# Patient Record
Sex: Male | Born: 1950 | Hispanic: Yes | Marital: Married | Smoking: Never smoker
Health system: Southern US, Community
[De-identification: ages and names within clinical notes are randomized; demographics above are authoritative.]

## PROBLEM LIST (undated history)

## (undated) DIAGNOSIS — E7800 Pure hypercholesterolemia, unspecified: Secondary | ICD-10-CM

## (undated) DIAGNOSIS — E079 Disorder of thyroid, unspecified: Secondary | ICD-10-CM

## (undated) DIAGNOSIS — I10 Essential (primary) hypertension: Secondary | ICD-10-CM

## (undated) DIAGNOSIS — F419 Anxiety disorder, unspecified: Secondary | ICD-10-CM

## (undated) DIAGNOSIS — E119 Type 2 diabetes mellitus without complications: Secondary | ICD-10-CM

## (undated) HISTORY — PX: PANCREATIC CYST EXCISION: SHX2157

## (undated) HISTORY — PX: COLON SURGERY: SHX602

---

## 2004-06-21 ENCOUNTER — Emergency Department: Payer: Self-pay | Admitting: Emergency Medicine

## 2004-06-22 ENCOUNTER — Other Ambulatory Visit: Payer: Self-pay

## 2004-06-25 ENCOUNTER — Emergency Department: Payer: Self-pay | Admitting: Emergency Medicine

## 2004-07-07 ENCOUNTER — Emergency Department: Payer: Self-pay | Admitting: Emergency Medicine

## 2004-07-07 ENCOUNTER — Other Ambulatory Visit: Payer: Self-pay

## 2013-01-24 DIAGNOSIS — R140 Abdominal distension (gaseous): Secondary | ICD-10-CM | POA: Insufficient documentation

## 2013-01-24 DIAGNOSIS — Z1211 Encounter for screening for malignant neoplasm of colon: Secondary | ICD-10-CM | POA: Insufficient documentation

## 2013-12-04 ENCOUNTER — Emergency Department: Payer: Self-pay | Admitting: Emergency Medicine

## 2013-12-04 LAB — COMPREHENSIVE METABOLIC PANEL
AST: 28 U/L (ref 15–37)
Albumin: 4.2 g/dL (ref 3.4–5.0)
Alkaline Phosphatase: 81 U/L
Anion Gap: 7 (ref 7–16)
BILIRUBIN TOTAL: 0.6 mg/dL (ref 0.2–1.0)
BUN: 14 mg/dL (ref 7–18)
Calcium, Total: 8.6 mg/dL (ref 8.5–10.1)
Chloride: 105 mmol/L (ref 98–107)
Co2: 31 mmol/L (ref 21–32)
Creatinine: 0.74 mg/dL (ref 0.60–1.30)
EGFR (African American): >60
GLUCOSE: 107 mg/dL — AB (ref 65–99)
Osmolality: 286 (ref 275–301)
POTASSIUM: 3.9 mmol/L (ref 3.5–5.1)
SGPT (ALT): 36 U/L
SODIUM: 143 mmol/L (ref 136–145)
Total Protein: 7.7 g/dL (ref 6.4–8.2)

## 2013-12-04 LAB — CBC
HCT: 45.3 % (ref 40.0–52.0)
HGB: 15.4 g/dL (ref 13.0–18.0)
MCH: 31 pg (ref 26.0–34.0)
MCHC: 34.1 g/dL (ref 32.0–36.0)
MCV: 91 fL (ref 80–100)
Platelet: 115 10*3/uL — ABNORMAL LOW (ref 150–440)
RBC: 4.98 10*6/uL (ref 4.40–5.90)
RDW: 13.1 % (ref 11.5–14.5)
WBC: 3.8 10*3/uL (ref 3.8–10.6)

## 2013-12-04 LAB — LIPASE, BLOOD: LIPASE: 52 U/L — AB (ref 73–393)

## 2014-05-10 ENCOUNTER — Emergency Department
Admit: 2014-05-10 | Disposition: A | Discharge status: Home / Self Care | Payer: Self-pay | Admitting: Emergency Medicine

## 2014-06-02 ENCOUNTER — Emergency Department
Admit: 2014-06-02 | Disposition: A | Discharge status: Home / Self Care | Payer: Self-pay | Admitting: Emergency Medicine

## 2014-06-02 LAB — CBC WITH DIFFERENTIAL/PLATELET
BASOS PCT: 0.3 %
Basophil #: 0 10*3/uL (ref 0.0–0.1)
EOS PCT: 1.2 %
Eosinophil #: 0.1 10*3/uL (ref 0.0–0.7)
HCT: 43.4 % (ref 40.0–52.0)
HGB: 15.2 g/dL (ref 13.0–18.0)
Lymphocyte #: 0.9 10*3/uL — ABNORMAL LOW (ref 1.0–3.6)
Lymphocyte %: 14.9 %
MCH: 31.6 pg (ref 26.0–34.0)
MCHC: 35 g/dL (ref 32.0–36.0)
MCV: 90 fL (ref 80–100)
MONO ABS: 0.3 x10 3/mm (ref 0.2–1.0)
Monocyte %: 5.1 %
Neutrophil #: 4.8 10*3/uL (ref 1.4–6.5)
Neutrophil %: 78.5 %
PLATELETS: 114 10*3/uL — AB (ref 150–440)
RBC: 4.81 10*6/uL (ref 4.40–5.90)
RDW: 13.6 % (ref 11.5–14.5)
WBC: 6.1 10*3/uL (ref 3.8–10.6)

## 2014-06-02 LAB — URINALYSIS, COMPLETE
BILIRUBIN, UR: NEGATIVE
Bacteria: NONE SEEN
Blood: NEGATIVE
Leukocyte Esterase: NEGATIVE
Nitrite: NEGATIVE
PH: 8 (ref 4.5–8.0)
Protein: NEGATIVE
SPECIFIC GRAVITY: 1.018 (ref 1.003–1.030)
SQUAMOUS EPITHELIAL: NONE SEEN

## 2014-06-02 LAB — COMPREHENSIVE METABOLIC PANEL
ALBUMIN: 4.8 g/dL
ALK PHOS: 78 U/L
ANION GAP: 7 (ref 7–16)
BILIRUBIN TOTAL: 0.8 mg/dL
BUN: 15 mg/dL
CHLORIDE: 103 mmol/L
CREATININE: 0.69 mg/dL
Calcium, Total: 9.1 mg/dL
Co2: 27 mmol/L
EGFR (African American): >60
EGFR (Non-African Amer.): >60
Glucose: 180 mg/dL — ABNORMAL HIGH
Potassium: 3.8 mmol/L
SGOT(AST): 35 U/L
SGPT (ALT): 45 U/L
Sodium: 137 mmol/L
TOTAL PROTEIN: 7.9 g/dL

## 2014-06-02 LAB — LIPASE, BLOOD: Lipase: 24 U/L

## 2014-06-02 LAB — TROPONIN I: Troponin-I: <0.03 ng/mL

## 2014-06-03 LAB — LACTIC ACID, PLASMA: Lactic Acid, Venous: 1.1 mmol/L

## 2014-10-22 ENCOUNTER — Encounter: Payer: Self-pay | Admitting: Emergency Medicine

## 2014-10-22 ENCOUNTER — Emergency Department
Admission: EM | Admit: 2014-10-22 | Discharge: 2014-10-22 | Disposition: A | Discharge status: Home / Self Care | Payer: Self-pay | Attending: Emergency Medicine | Admitting: Emergency Medicine

## 2014-10-22 DIAGNOSIS — Z7951 Long term (current) use of inhaled steroids: Secondary | ICD-10-CM | POA: Insufficient documentation

## 2014-10-22 DIAGNOSIS — H6983 Other specified disorders of Eustachian tube, bilateral: Secondary | ICD-10-CM

## 2014-10-22 DIAGNOSIS — E119 Type 2 diabetes mellitus without complications: Secondary | ICD-10-CM | POA: Insufficient documentation

## 2014-10-22 DIAGNOSIS — H6993 Unspecified Eustachian tube disorder, bilateral: Secondary | ICD-10-CM | POA: Insufficient documentation

## 2014-10-22 DIAGNOSIS — H6523 Chronic serous otitis media, bilateral: Secondary | ICD-10-CM | POA: Insufficient documentation

## 2014-10-22 HISTORY — DX: Type 2 diabetes mellitus without complications: E11.9

## 2014-10-22 MED ORDER — AZITHROMYCIN 250 MG PO TABS: ORAL_TABLET | ORAL | Status: DC

## 2014-10-22 NOTE — ED Notes (Signed)
Pt to ed with c/o fluid in bilat ears x several months.  States he has been seen at [facility] and used meds without relief. Pt denies pain in ears.  Pt denies dizziness.

## 2014-10-22 NOTE — ED Provider Notes (Signed)
[facility] Emergency Department Provider Note ____________________________________________  Time seen: Approximately 9:41 AM  I have reviewed the triage vital signs and the nursing notes.   HISTORY  Chief Complaint Otalgia   HPI Alvin [name] is a 64 y.o. male who presents to the emergency department for evaluation of a fullness and fluid like feeling in both ears. Symptoms have been present for several months and he has seen his provider [facility] a few times for the same. He has been given fluticasone nasal spray and cetirizine and has been advised to continue those medications. A referral was written to the ENT specialist, however he has not been contacted regarding his appointment. He is here today for continued symptoms. Symptoms aren't not worse, but not improving with medications given by [facility]. He denies pain in the ears and he denies fever.   Past Medical History  Diagnosis Date  . Diabetes mellitus without complication     There are no active problems to display for this patient.   History reviewed. No pertinent past surgical history.  Current Outpatient Rx  Name  Route  Sig  Dispense  Refill  . fluticasone (FLONASE) 50 MCG/ACT nasal spray   Each Nare   Place 2 sprays into both nostrils daily.         Marland Kitchen azithromycin (ZITHROMAX Z-PAK) 250 MG tablet      Take 2 tablets (500 mg) on  Day 1,  followed by 1 tablet (250 mg) once daily on Days 2 through 5.   6 each   0     Allergies Review of patient's allergies indicates no known allergies.  History reviewed. No pertinent family history.  Social History Social History  Substance Use Topics  . Smoking status: Never Smoker   . Smokeless tobacco: None  . Alcohol Use: No    Review of Systems Constitutional: No fever/chills Eyes: No visual changes. ENT: Earache:no; Discharge: no; Hearing Loss: yes; Trauma: no; Sore throat: no;  Respiratory: No Cough or  dyspnea Gastrointestinal: No abdominal pain.  No nausea, no vomiting.  No diarrhea.  No constipation. Musculoskeletal: Negative for pain. Skin: Negative for rash. Neurological: Negative for headaches, focal weakness or numbness.  10-point ROS otherwise negative.  ____________________________________________   PHYSICAL EXAM:  VITAL SIGNS: ED Triage Vitals  Enc Vitals Group     BP 10/22/14 0835 146/88 mmHg     Pulse Rate 10/22/14 0835 96     Resp 10/22/14 0835 20     Temp 10/22/14 0835 98.9 F (37.2 C)     Temp Source 10/22/14 0835 Oral     SpO2 10/22/14 0835 99 %     Weight 10/22/14 0835 220 lb (99.791 kg)     Height 10/22/14 0835 5\' 4"  (1.626 m)     Head Cir --      Peak Flow --      Pain Score 10/22/14 0836 0     Pain Loc --      Pain Edu? --      Excl. in GC? --     Constitutional: Alert and oriented. Well appearing and in no acute distress. Eyes: Conjunctivae are normal. PERRL. EOMI. Ears: Pain with movement of auricle: no; External canal: Normal; TM's: Serous otitis media bilaterally with mild TM erythema;   Head: Atraumatic. Nose: No congestion/rhinnorhea. Mouth/Throat: Mucous membranes are moist.  Oropharynx non-erythematous. Neck: No stridor.  Hematological/Lymphatic/Immunilogical: No cervical lymphadenopathy. Cardiovascular: Normal rate, regular rhythm.Good peripheral circulation. Respiratory: Normal respiratory effort.  No retractions.  Gastrointestinal: Soft and nontender. No distention. No abdominal bruits. No CVA tenderness. Musculoskeletal: Full ROM x 4. Neurologic:  Normal speech and language. No gross focal neurologic deficits are appreciated. Speech is normal. No gait instability. Skin:  Skin is warm, dry and intact. No rash noted. Psychiatric: Mood and affect are normal. Speech and behavior are normal.  ____________________________________________   LABS (all labs ordered are listed, but only abnormal results are displayed)  Labs Reviewed - No  data to display ____________________________________________   RADIOLOGY   ____________________________________________   PROCEDURES  Procedure(s) performed: None  ____________________________________________   INITIAL IMPRESSION / ASSESSMENT AND PLAN / ED COURSE  Pertinent labs & imaging results that were available during my care of the patient were reviewed by me and considered in my medical decision making (see chart for details).  Because the patient has had symptoms for several months and is diabetic I will prescribe a round of azithromycin. He has not been on antibiotics since the onset of this complaint.  Patient was advised to follow up with the primary care provider or ENT doctor for symptoms that are not improving over the next 48 hours. Return to the ER for symptoms that change or worsen if you are unable to schedule an appointment. ____________________________________________   FINAL CLINICAL IMPRESSION(S) / ED DIAGNOSES  Final diagnoses:  Bilateral chronic serous otitis media  Eustachian tube dysfunction, bilateral      Chinita Pester, FNP 10/22/14 0945  Jeanmarie Plant, MD 10/22/14 1501

## 2014-11-25 ENCOUNTER — Other Ambulatory Visit: Payer: Self-pay

## 2014-11-25 ENCOUNTER — Telehealth: Payer: Self-pay

## 2014-11-25 NOTE — Telephone Encounter (Signed)
Called patient and he son answered the call. I asked to speak with his father and he told me that his father doesn't live with him. I then asked him for his father's contact number and he hesitated. He asked me the reason for contacting his father and I told him he had an appointment with us on Friday 11/27/2014 and needed to know the exact reason why he was coming to our office. He then stated that he has [facility] and was told that he needed to be seen by a Gastrologist but the [facility] of [facility] and not us since he is a self-pay patient. I agreed with him. So I asked who was his father's primary care doctor so I could call for a referral. He stated that [facility] is seen [facility]. I told him that I would call [facility]. He agreed. I called [facility] and spoke to Mant[place]onya ([employer] Coordinator) and asked why patient was referred to our office. She stated that nobody had contacted us to set up an appointment for the patient. The only thing she told me was that he had a colonoscopy done on 11/02/2014 but that everything was fine. She was not sure who had called us to schedule this appointment.  I then called [facility] and asked if patient had an upcoming appointment to be seen by GI and they stated that patient didn't have anything scheduled. So, after all, I ended up calling his son again and told him what was going on. He then finally gave me his father's cell phone [phone]) and asked for me to call him since he had no clue of what is going on. I thanked him and told him that I would call his father and ask. Called patient and it went straight to voicemail. I left him a voicemail in Spanish so he could return my call.

## 2014-11-25 NOTE — Telephone Encounter (Signed)
Called patient and had to leave a voicemail again. If patient doesn't call, we will cancel his appointment since we are not sure why he needed to be seen.

## 2014-11-26 ENCOUNTER — Telehealth: Payer: Self-pay

## 2014-11-26 ENCOUNTER — Encounter: Payer: Self-pay | Admitting: *Deleted

## 2014-11-26 NOTE — Telephone Encounter (Signed)
Called patient again and had to leave a voicemail. Since patient did not answer my calls, I will cancel his appointment.

## 2014-11-27 ENCOUNTER — Encounter (INDEPENDENT_AMBULATORY_CARE_PROVIDER_SITE_OTHER): Payer: Self-pay

## 2014-11-27 ENCOUNTER — Ambulatory Visit: Payer: Self-pay | Admitting: Surgery

## 2014-11-27 ENCOUNTER — Other Ambulatory Visit: Payer: Self-pay

## 2014-11-27 ENCOUNTER — Encounter: Payer: Self-pay | Admitting: Surgery

## 2014-11-27 ENCOUNTER — Ambulatory Visit (INDEPENDENT_AMBULATORY_CARE_PROVIDER_SITE_OTHER): Payer: Self-pay | Admitting: Surgery

## 2014-11-27 VITALS — BP 143/81 | HR 80 | Temp 98.3°F | Ht 59.0 in | Wt 222.0 lb

## 2014-11-27 DIAGNOSIS — K8020 Calculus of gallbladder without cholecystitis without obstruction: Secondary | ICD-10-CM

## 2014-11-27 DIAGNOSIS — R1031 Right lower quadrant pain: Secondary | ICD-10-CM

## 2014-11-27 NOTE — Patient Instructions (Signed)
Por favor necesito que mantenga un diario todos los dias de el tipo de comida que come, Psychologist, prison and probation servicessi le causa dolor, o vomita.   Reyne DumasLe van a llamar para hacerle un ultrasonido.  Lo veo en dos semanas en la oficina de [place].

## 2014-11-30 ENCOUNTER — Telehealth: Payer: Self-pay

## 2014-11-30 NOTE — Telephone Encounter (Signed)
[name] and requested for his medical records. Alvin [name] (referral coordinator) stated that she would fax that information for us.

## 2014-12-01 DIAGNOSIS — R1031 Right lower quadrant pain: Secondary | ICD-10-CM | POA: Insufficient documentation

## 2014-12-01 DIAGNOSIS — K8020 Calculus of gallbladder without cholecystitis without obstruction: Secondary | ICD-10-CM | POA: Insufficient documentation

## 2014-12-01 NOTE — Progress Notes (Signed)
Patient ID: Alvin [name], male   DOB: 1950-08-27, 64 y.o.   MRN: [id number]  Chief Complaint  Patient presents with  . Follow-up    ED-Gallbladder 05/2014    HPI  Alvin [name] is a 64 y.o. male. referred by his PCP with a several week history of what he describes as inflammation in his abdomen in both the RUQ and LLQ.  He does not say specifically that he has abdominal pain.   He recently had a colonoscopy at [facility] showing no significant findings.  He was seen in the ER about 6 months ago with pain his RUQ pain and was told her had gallstones.  He did not seek surgical referral.  In addition, he did not have any pain issues until several weeks ago when his symptoms restarted.  Denies nausea or vomiting and does not relate "inflammatio" in his abdomen to any particular foods.  Past Medical History  Diagnosis Date  . Diabetes mellitus without complication (HCC)   Gout Seasonal allergies.  Past surgical history is significant for pancreatic surgery in [place] 15 years ago for a "cyst".     Family History  Problem Relation Age of Onset  . Alcohol abuse Neg Hx   . Arthritis Neg Hx   . Asthma Neg Hx   . Birth defects Neg Hx   . Cancer Neg Hx   . COPD Neg Hx   . Depression Neg Hx   . Diabetes Neg Hx   . Drug abuse Neg Hx   . Early death Neg Hx   . Hearing loss Neg Hx   . Heart disease Neg Hx   . Hyperlipidemia Neg Hx   . Hypertension Neg Hx   . Kidney disease Neg Hx   . Learning disabilities Neg Hx   . Mental illness Neg Hx   . Mental retardation Neg Hx   . Stroke Neg Hx   . Miscarriages / Stillbirths Neg Hx   . Vision loss Neg Hx   . Varicose Veins Neg Hx     Social History Social History  Substance Use Topics  . Smoking status: Never Smoker   . Smokeless tobacco: None  . Alcohol Use: 0.0 oz/week    0 Standard drinks or equivalent per week     Comment: occas    No Known Allergies  Current Outpatient Prescriptions  Medication Sig Dispense Refill  . allopurinol  (ZYLOPRIM) 300 MG tablet Take 300 mg by mouth.    . cetirizine (ZYRTEC) 10 MG tablet Take 10 mg by mouth.    . fluticasone (FLONASE) 50 MCG/ACT nasal spray Place 2 sprays into both nostrils daily.    Marland Kitchen glipiZIDE (GLUCOTROL) 5 MG tablet Take 5 mg by mouth 2 (two) times daily before a meal.      No current facility-administered medications for this visit.   Blood pressure 143/81, pulse 80, temperature 98.3 F (36.8 C), temperature source Oral, height  (1.499 m), weight 222 lb (100.699 kg).  No results found for this or any previous visit (from the past 48 hour(s)). No results found.  Review of Systems  Constitutional: Negative.   Eyes: Negative.   Gastrointestinal: Negative for nausea and vomiting.  Genitourinary: Negative.   Musculoskeletal: Negative.   Neurological: Negative.   All other systems reviewed and are negative.   Physical Exam  Constitutional: He is oriented to person, place, and time and well-developed, well-nourished, and in no distress. No distress.  HENT:  Head: Normocephalic and atraumatic.  Eyes: Conjunctivae  are normal. Pupils are equal, round, and reactive to light. No scleral icterus.  Neck: No JVD present. No tracheal deviation present. No thyromegaly present.  Cardiovascular: Normal rate and regular rhythm.   No murmur heard. Pulmonary/Chest: Effort normal and breath sounds normal. No respiratory distress. He has no wheezes.  Abdominal: Soft. He exhibits no distension and no mass. There is no tenderness. There is no rebound and no guarding.    Neurological: He is alert and oriented to person, place, and time.  Skin: Skin is warm and dry. He is not diaphoretic.  Psychiatric: Mood, memory, affect and judgment normal.    Data Reviewed  I have personally reviewed the patient's imaging, laboratory findings and medical records.    Assessment    64 y/o male with gallstones and perhaps biliary colic vs IBS.  He is requesting another ultrasound of his  liver and gallbladder.    Plan   A repeat ultrasound was ordered,  I told him that I did not think gallstones would has resolved.  I asked him to keep a food abominal pain and food diary.  I interviewed him with a certified spanish interpreter present.  I will request medical records from [facility] who is his PCP.     Natale LayMark Ras Kollman MD, FACS 12/01/2014, 9:16 PM

## 2014-12-02 ENCOUNTER — Telehealth: Payer: Self-pay

## 2014-12-02 NOTE — Telephone Encounter (Signed)
Called central scheduling and was able to schedule patient's Ultrasound for Friday 12/04/2014 at 9:30 AM. Then I called and spoke to patient's son. He was told to be there at 9:15 AM and to be fasting for this procedure. His son understood.  [place].

## 2014-12-04 ENCOUNTER — Ambulatory Visit
Admission: RE | Admit: 2014-12-04 | Discharge: 2014-12-04 | Disposition: A | Discharge status: Home / Self Care | Payer: Self-pay | Source: Ambulatory Visit | Attending: Surgery | Admitting: Surgery

## 2014-12-04 DIAGNOSIS — R1031 Right lower quadrant pain: Secondary | ICD-10-CM | POA: Insufficient documentation

## 2014-12-04 DIAGNOSIS — K8020 Calculus of gallbladder without cholecystitis without obstruction: Secondary | ICD-10-CM | POA: Insufficient documentation

## 2014-12-04 DIAGNOSIS — K760 Fatty (change of) liver, not elsewhere classified: Secondary | ICD-10-CM | POA: Insufficient documentation

## 2014-12-10 ENCOUNTER — Ambulatory Visit: Payer: Self-pay | Admitting: Surgery

## 2014-12-10 ENCOUNTER — Ambulatory Visit (INDEPENDENT_AMBULATORY_CARE_PROVIDER_SITE_OTHER): Payer: Self-pay | Admitting: Surgery

## 2014-12-10 ENCOUNTER — Encounter: Payer: Self-pay | Admitting: Surgery

## 2014-12-10 VITALS — BP 161/94 | HR 75 | Temp 97.8°F | Ht 63.0 in | Wt 228.0 lb

## 2014-12-10 DIAGNOSIS — K8020 Calculus of gallbladder without cholecystitis without obstruction: Secondary | ICD-10-CM

## 2014-12-10 NOTE — Progress Notes (Signed)
Surgery clinic  She returns today. He is without complaints. He denies any pain since our last visit. Repeat ultrasound did demonstrate cholelithiasis.  I again spoke to him about postprandial right upper quadrant abdominal pain which he has denied on multiple occasions. There is been no nausea and no vomiting. 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 Those records are not available to me.  Filed Vitals:   12/10/14 1311  BP: 161/94  Pulse: 75  Temp: 97.8 F (36.6 C)    On physical examination he is alert and oriented. Sclera are a neck trach. Abdomen is soft and nontender there is a midline upper scar. Small umbilical reducible hernia is present.  Impression abdominal pain and cholelithiasis. I do not believe that any of his symptoms are consistent with biliary colic.  I used a certified Research officer, trade unionpanish interpreter. His son was present.  Recommendations at present no surgical intervention is recommended. He was told to contact his primary care physician if he has continued abdominal pain. I believe that a cholecystectomy and may be difficult due to his previous pancreatic procedure the details of which furthermore unknown to me.

## 2014-12-10 NOTE — Patient Instructions (Signed)
Follow up in our office as needed. 

## 2015-02-26 ENCOUNTER — Emergency Department
Admission: EM | Admit: 2015-02-26 | Discharge: 2015-02-26 | Disposition: A | Discharge status: Home / Self Care | Payer: Self-pay | Attending: Emergency Medicine | Admitting: Emergency Medicine

## 2015-02-26 ENCOUNTER — Encounter: Payer: Self-pay | Admitting: Emergency Medicine

## 2015-02-26 DIAGNOSIS — E119 Type 2 diabetes mellitus without complications: Secondary | ICD-10-CM | POA: Insufficient documentation

## 2015-02-26 DIAGNOSIS — Z7984 Long term (current) use of oral hypoglycemic drugs: Secondary | ICD-10-CM | POA: Insufficient documentation

## 2015-02-26 DIAGNOSIS — J029 Acute pharyngitis, unspecified: Secondary | ICD-10-CM | POA: Insufficient documentation

## 2015-02-26 DIAGNOSIS — Z79899 Other long term (current) drug therapy: Secondary | ICD-10-CM | POA: Insufficient documentation

## 2015-02-26 DIAGNOSIS — Z7951 Long term (current) use of inhaled steroids: Secondary | ICD-10-CM | POA: Insufficient documentation

## 2015-02-26 DIAGNOSIS — H109 Unspecified conjunctivitis: Secondary | ICD-10-CM | POA: Insufficient documentation

## 2015-02-26 LAB — POCT RAPID STREP A: STREPTOCOCCUS, GROUP A SCREEN (DIRECT): NEGATIVE

## 2015-02-26 MED ORDER — AZITHROMYCIN 250 MG PO TABS: ORAL_TABLET | ORAL | Status: DC

## 2015-02-26 MED ORDER — SULFACETAMIDE SODIUM 10 % OP SOLN: 2.0000 [drp] | Freq: Four times a day (QID) | OPHTHALMIC | Status: DC

## 2015-02-26 NOTE — ED Provider Notes (Signed)
[facility] Emergency Department Provider Note  ____________________________________________  Time seen: Approximately 8:25 AM  I have reviewed the triage vital signs and the nursing notes.   HISTORY  Chief Complaint Eye Drainage   HPI Alvin [name] is a 65 y.o. male presents for evaluation of bilateral eye redness, and sore throat.Patient reports symptoms of an ongoing for at least 2 days. Has tried over-the-counter medication with no relief. In addition patient complains that his throat feels full hurts to swallow. Denies any shortness of breath or dyspnea. Patient's past medical history significant for diabetes   Past Medical History  Diagnosis Date  . Diabetes mellitus without complication [facility])     Patient Active Problem List   Diagnosis Date Noted  . Right lower quadrant abdominal pain 12/01/2014  . Gallstones 12/01/2014  . Encounter for screening for malignant neoplasm of colon 01/24/2013  . Abdominal bloating 01/24/2013    Past Surgical History  Procedure Laterality Date  . Pancreatic cyst excision      Current Outpatient Rx  Name  Route  Sig  Dispense  Refill  . allopurinol (ZYLOPRIM) 300 MG tablet   Oral   Take 300 mg by mouth.         . cetirizine (ZYRTEC) 10 MG tablet   Oral   Take 10 mg by mouth.         . fluticasone (FLONASE) 50 MCG/ACT nasal spray   Each Nare   Place 2 sprays into both nostrils daily.         Marland Kitchen glipiZIDE (GLUCOTROL) 5 MG tablet   Oral   Take 5 mg by mouth 2 (two) times daily before a meal.          . sulfacetamide (BLEPH-10) 10 % ophthalmic solution   Both Eyes   Place 2 drops into both eyes 4 (four) times daily.   5 mL   0     Allergies Review of patient's allergies indicates no known allergies.  Family History  Problem Relation Age of Onset  . Alcohol abuse Neg Hx   . Arthritis Neg Hx   . Asthma Neg Hx   . Birth defects Neg Hx   . Cancer Neg Hx   . COPD Neg Hx   . Depression  Neg Hx   . Diabetes Neg Hx   . Drug abuse Neg Hx   . Early death Neg Hx   . Hearing loss Neg Hx   . Heart disease Neg Hx   . Hyperlipidemia Neg Hx   . Hypertension Neg Hx   . Kidney disease Neg Hx   . Learning disabilities Neg Hx   . Mental illness Neg Hx   . Mental retardation Neg Hx   . Stroke Neg Hx   . Miscarriages / Stillbirths Neg Hx   . Vision loss Neg Hx   . Varicose Veins Neg Hx     Social History Social History  Substance Use Topics  . Smoking status: Never Smoker   . Smokeless tobacco: Never Used  . Alcohol Use: 0.0 oz/week    0 Standard drinks or equivalent per week     Comment: occas    Review of Systems Constitutional: No fever/chills Eyes: No visual changes. ENT: No sore throat. Cardiovascular: Denies chest pain. Respiratory: Denies shortness of breath. Gastrointestinal: No abdominal pain.  No nausea, no vomiting.  No diarrhea.  No constipation. Genitourinary: Negative for dysuria. Musculoskeletal: Negative for back pain. Skin: Negative for rash. Neurological: Negative for headaches,  focal weakness or numbness.  10-point ROS otherwise negative.  ____________________________________________   PHYSICAL EXAM:  VITAL SIGNS: ED Triage Vitals  Enc Vitals Group     BP 02/26/15 0824 155/85 mmHg     Pulse Rate 02/26/15 0823 77     Resp 02/26/15 0823 16     Temp 02/26/15 0823 98.1 F (36.7 C)     Temp Source 02/26/15 0823 Oral     SpO2 02/26/15 0823 95 %     Weight 02/26/15 0823 170 lb (77.111 kg)     Height --      Head Cir --      Peak Flow --      Pain Score 02/26/15 0820 2     Pain Loc --      Pain Edu? --      Excl. in GC? --     Constitutional: Alert and oriented. Well appearing and in no acute distress. Eyes: Conjunctiva are erythematous bilaterally drainage noted Head: Atraumatic. Nose: No congestion/rhinnorhea. Mouth/Throat: Mucous membranes are moist.  Oropharynx mildly erythematous. Neck: No stridor.   Cardiovascular: Normal  rate, regular rhythm. Grossly normal heart sounds.  Good peripheral circulation. Respiratory: Normal respiratory effort.  No retractions. Lungs CTAB. Musculoskeletal: No lower extremity tenderness nor edema.  No joint effusions. Neurologic:  Normal speech and language. No gross focal neurologic deficits are appreciated. No gait instability. Skin:  Skin is warm, dry and intact. No rash noted. Psychiatric: Mood and affect are normal. Speech and behavior are normal.  ____________________________________________   LABS (all labs ordered are listed, but only abnormal results are displayed)  Labs Reviewed  CULTURE, GROUP A STREP [facility])   ____________________________________________   PROCEDURES  Procedure(s) performed: None  Critical Care performed: No  ____________________________________________   INITIAL IMPRESSION / ASSESSMENT AND PLAN / ED COURSE  Pertinent labs & imaging results that were available during my care of the patient were reviewed by me and considered in my medical decision making (see chart for details).  Acute bilateral conjunctivitis, URI. Rx given for Sodium Sulamyd eye drops 4 times a day. Z-Pak as directed. Patient follow-up with PCP or return to the ER with any worsening symptomology. Patient voices no other emergency medical complaints at this time. ____________________________________________   FINAL CLINICAL IMPRESSION(S) / ED DIAGNOSES  Final diagnoses:  Bilateral conjunctivitis  Acute pharyngitis, unspecified pharyngitis type      Evangeline Dakin, PA-C 02/26/15 1610  Myrna Blazer, MD 02/26/15 [phone]

## 2015-02-26 NOTE — Discharge Instructions (Signed)
Conjuntivitis bacteriana  (Bacterial Conjunctivitis)  La conjuntivitis bacteriana, comnmente llamada ojo rosado, es una inflamacin de la membrana transparente que cubre la parte blanca del ojo (conjuntiva). La inflamacin tambin puede ocurrir en la parte interna de los prpados. Los vasos sanguneos de la conjuntiva se inflaman haciendo que el ojo se ponga de color rojo o rosado. La conjuntivitis bacteriana puede propagarse fcilmente de un ojo a otro y de Neomia Dear persona a otra (es contagiosa).  CAUSAS  La causa de la conjuntivitis bacteriana es una bacteria. La bacteria puede provenir de la propia piel, del tracto respiratorio superior o de otra persona que padece conjuntivitis bacteriana.  SNTOMAS  La parte del ojo o la zona interna del prpado que normalmente son blancas se ven de color rosado o rojo. El ojo rosado se asocia a Consulting civil engineer, lagrimeo y sensibilidad a Statistician. La conjuntivitis bacteriana se asocia a una secrecin espesa y [place] de los ojos. La secrecin puede convertirse en una costra en el prpado durante la noche, lo que hace que los prpados se peguen. Si tiene secrecin, tambin puede tener visin borrosa en el ojo afectado.  DIAGNSTICO  El diagnstico de conjuntivitis bacteriana lo realiza el mdico con un examen de la vista y por los sntomas que usted refiere. Su mdico observar si hay cambios en los tejidos de la superficie de los ojos, los que pueden indicar el tipo especfico de conjuntivitis. Tomar una muestra de la secrecin con un hisopo de algodn si la conjuntivitis es grave, si la crnea se ve afectada, o si sufre repetidas infecciones que no responden al tratamiento. Luego enva la muestra a un laboratorio para diagnosticar si la causa de la inflamacin es una infeccin bacteriana y para comprobar si responder a los antibiticos.  TRATAMIENTO  1. La conjuntivitis bacteriana se trata con antibiticos. Generalmente se recetan gotas oftlmicas con antibitico.  Tambin hay ungentos con antibiticos disponibles. En algunos casos se recetan antibiticos por va oral. Las lgrimas artificiales o el lavado del ojo pueden aliviar las [place]. INSTRUCCIONES PARA EL CUIDADO EN EL HOGAR  1. Para aliviar el malestar, aplique un pao hmedo, limpio y fro en el ojo durante 10 a 20 minutos, 3 a 4 veces por da. 2. Limpie suavemente las secreciones del ojo con un pao tibio y hmedo o una bolita de algodn. 3. Lave sus manos frecuentemente con agua y [place]. Use toallas de papel para secarse las manos. 4. No comparta toallones ni toallas de mano. As podr diseminarse la infeccin. 5. Cambie o lave la funda de la [employer]. 6. No use maquillaje en los ojos hasta que la infeccin haya desaparecido. 7. No maneje maquinaria ni conduzca vehculos si su visin es borrosa. 8. Deje de usar los entes de [place]. Consulte con su mdico si debe esterilizar o reemplazar sus lentes de contacto antes de usarlos de nuevo. Esto depende del tipo de lentes de contacto que use. 9. Al aplicarse el medicamento en el ojo infectado, no toque el borde del prpado con el frasco de gotas para los ojos o el tubo de [place]. SOLICITE ATENCIN MDICA DE INMEDIATO SI:   La infeccin no mejora dentro de los [address]  [address] despus de iniciar [address]  Tuvo una secrecin amarillenta en el ojo y vuelve a Research officer, trade union.  Aumenta el dolor en el ojo.  El enrojecimiento se extiende.  La visin se vuelve borrosa.  Tiene fiebre o sntomas persistentes durante ms de 2  3 das.  Lance Muss y  los síntomas empeoran repentinamente. °· Siente dolor, enrojecimiento o hinchazón en el rostro. °ASEGÚRESE DE QUE:  °· Comprende estas instrucciones. °· Controlará su enfermedad. °· Solicitará ayuda de inmediato si no mejora o si empeora. °  °Esta información no tiene como fin reemplazar el consejo del médico. Asegúrese de hacerle al médico cualquier pregunta que tenga. °  °Document Released:  11/02/2004 Document Revised: 10/18/2011 °Elsevier Interactive Patient Education ©2016 Elsevier Inc. ° °Cómo usar las gotas y las pomadas oftálmicas °(How to Use Eye Drops and Eye Ointments) °CÓMO APLICAR LAS GOTAS OFTÁLMICAS °Siga estos pasos cuando se ponga gotas oftálmicas: °2. Lávese las manos. °3. Incline la cabeza hacia atrás. °4. Ponga un dedo debajo del ojo y úselo para bajar suavemente el párpado inferior. Deje el dedo en el lugar. °5. Con la otra mano, sostenga el gotero entre el pulgar y el índice. °6. Coloque el gotero justo por encima del borde del párpado inferior. Sosténgalo tan cerca como pueda del ojo sin apoyarlo sobre este. °7. Mantenga firme la mano. Una forma de hacerlo es apoyar el índice contra la ceja. °8. Mire hacia arriba. °9. Lenta y suavemente apriete para que caiga una gota del medicamento en el ojo. °10. Cierre el ojo. °11. Coloque un dedo entre el párpado inferior y la nariz. Presione con suavidad durante 2 minutos. Esto aumenta el tiempo que el medicamento está en contacto con el ojo. También reduce los efectos secundarios que pueden aparecer si la gota llega al torrente sanguíneo a través de la nariz. °CÓMO APLICAR LAS POMADAS OFTÁLMICAS °Siga estos pasos cuando se aplique pomadas oftálmicas: °10. Lávese las manos. °11. Ponga un dedo debajo del ojo y úselo para bajar suavemente el párpado inferior. Deje el dedo en el lugar. °12. Con la otra mano, coloque la punta del tubo entre el pulgar y el índice con el resto de los dedos apoyados sobre la mejilla o la nariz. °13. Sostenga el tubo justo por encima del borde del párpado inferior sin apoyarlo sobre el párpado ni el globo ocular. °14. Mire hacia arriba. °15. Aplique la pomada a lo largo de la parte interna del párpado inferior. °16. Con suavidad, tire el párpado superior hacia arriba y mire hacia abajo. Esto hará que la pomada se esparza sobre la superficie del ojo. °17. Suelte el párpado superior. °18. Si puede, cierre los ojos  durante 1 o 2 minutos. °No se frote los ojos. Si aplicó correctamente la pomada, tendrá la visión borrosa durante algunos minutos. Esto es normal. °INFORMACIÓN ADICIONAL °· Use las gotas o la pomada oftálmica como se lo haya indicado el médico. °· Si le indicaron que use gotas y una pomada oftálmica, aplique primero las gotas; luego espere entre 3 y 4 minutos antes de colocarse la pomada. °· Intente no apoyar la punta del gotero o del tubo en el ojo. Un gotero o un tubo que hayan estado en contacto con el ojo pueden contaminarse. °  °Esta información no tiene como fin reemplazar el consejo del médico. Asegúrese de hacerle al médico cualquier pregunta que tenga. °  °Document Released: 01/23/2005 Document Revised: 06/09/2014 °Elsevier Interactive Patient Education ©2016 Elsevier Inc. ° °

## 2015-02-26 NOTE — ED Notes (Signed)
Pt also c/o sore throat.

## 2015-02-26 NOTE — ED Notes (Signed)
Bilateral eye redness   And irritation

## 2015-02-28 LAB — CULTURE, GROUP A STREP (THRC)

## 2015-03-28 ENCOUNTER — Emergency Department
Admission: EM | Admit: 2015-03-28 | Discharge: 2015-03-28 | Disposition: A | Discharge status: Home / Self Care | Payer: Self-pay | Attending: Emergency Medicine | Admitting: Emergency Medicine

## 2015-03-28 ENCOUNTER — Emergency Department: Payer: Self-pay

## 2015-03-28 ENCOUNTER — Encounter: Payer: Self-pay | Admitting: Emergency Medicine

## 2015-03-28 DIAGNOSIS — Z79899 Other long term (current) drug therapy: Secondary | ICD-10-CM | POA: Insufficient documentation

## 2015-03-28 DIAGNOSIS — Z7951 Long term (current) use of inhaled steroids: Secondary | ICD-10-CM | POA: Insufficient documentation

## 2015-03-28 DIAGNOSIS — J101 Influenza due to other identified influenza virus with other respiratory manifestations: Secondary | ICD-10-CM | POA: Insufficient documentation

## 2015-03-28 DIAGNOSIS — Z7984 Long term (current) use of oral hypoglycemic drugs: Secondary | ICD-10-CM | POA: Insufficient documentation

## 2015-03-28 DIAGNOSIS — E119 Type 2 diabetes mellitus without complications: Secondary | ICD-10-CM | POA: Insufficient documentation

## 2015-03-28 LAB — CBC WITH DIFFERENTIAL/PLATELET
Basophils Absolute: 0 10*3/uL (ref 0–0.1)
Basophils Relative: 0 %
EOS ABS: 0.3 10*3/uL (ref 0–0.7)
HCT: 42.7 % (ref 40.0–52.0)
Hemoglobin: 15 g/dL (ref 13.0–18.0)
LYMPHS ABS: 0.6 10*3/uL — AB (ref 1.0–3.6)
Lymphocytes Relative: 12 %
MCH: 30.9 pg (ref 26.0–34.0)
MCHC: 35 g/dL (ref 32.0–36.0)
MCV: 88.2 fL (ref 80.0–100.0)
Monocytes Absolute: 0.4 10*3/uL (ref 0.2–1.0)
Monocytes Relative: 8 %
Neutro Abs: 4.1 10*3/uL (ref 1.4–6.5)
Neutrophils Relative %: 75 %
PLATELETS: 90 10*3/uL — AB (ref 150–440)
RBC: 4.85 MIL/uL (ref 4.40–5.90)
RDW: 13.6 % (ref 11.5–14.5)
WBC: 5.5 10*3/uL (ref 3.8–10.6)

## 2015-03-28 LAB — BASIC METABOLIC PANEL
ANION GAP: 7 (ref 5–15)
BUN: 15 mg/dL (ref 6–20)
CHLORIDE: 108 mmol/L (ref 101–111)
CO2: 25 mmol/L (ref 22–32)
Calcium: 8.8 mg/dL — ABNORMAL LOW (ref 8.9–10.3)
Creatinine, Ser: 0.62 mg/dL (ref 0.61–1.24)
GFR calc Af Amer: >60 mL/min (ref >60)
GLUCOSE: 166 mg/dL — AB (ref 65–99)
POTASSIUM: 3.9 mmol/L (ref 3.5–5.1)
Sodium: 140 mmol/L (ref 135–145)

## 2015-03-28 LAB — RAPID INFLUENZA A&B ANTIGENS: Influenza A (ARMC): POSITIVE

## 2015-03-28 LAB — RAPID INFLUENZA A&B ANTIGENS (ARMC ONLY): INFLUENZA B (ARMC): NEGATIVE

## 2015-03-28 LAB — GLUCOSE, CAPILLARY: Glucose-Capillary: 149 mg/dL — ABNORMAL HIGH (ref 65–99)

## 2015-03-28 MED ORDER — KETOROLAC TROMETHAMINE 30 MG/ML IJ SOLN
30.0000 mg | Freq: Once | INTRAMUSCULAR | Status: AC
  Administered 2015-03-28: 30 mg via INTRAMUSCULAR
  Filled 2015-03-28: qty 1

## 2015-03-28 MED ORDER — OSELTAMIVIR PHOSPHATE 75 MG PO CAPS: 75.0000 mg | ORAL_CAPSULE | Freq: Two times a day (BID) | ORAL | Status: DC

## 2015-03-28 NOTE — ED Notes (Signed)
Patient transported to X-ray 

## 2015-03-28 NOTE — Discharge Instructions (Signed)
Gripe - Adultos  (Influenza, Adult)  La gripe es una infección viral del tracto respiratorio. Ocurre con más frecuencia en los meses de invierno, ya que las personas pasan más tiempo en contacto cercano. La gripe puede enfermarlo considerablemente. Se transmite fácilmente de una persona a otra (es contagiosa).  CAUSAS   La causa es un virus que infecta el tracto respiratorio. Puede contagiarse el virus al aspirar las gotitas que una persona infectada elimina al toser o estornudar. También puede contagiarse al tocar algo que fue recientemente contaminado con el virus y luego llevarse la mano a la boca, la nariz o los ojos.  RIESGOS Y COMPLICACIONES  Tendrá mayor riesgo de sufrir un resfrío grave si consume cigarrillos, es diabético, sufre una enfermedad cardíaca (como insuficiencia cardíaca) o pulmonar crónica (como asma) o si tiene debilitado el sistema inmunológico. Los ancianos y las mujeres embarazadas tienen más riesgo de sufrir infecciones graves. El problema más frecuente de la gripe es la infección pulmonar (neumonía). En algunos casos, este problema puede requerir atención médica de emergencia y poner en peligro la vida.  SIGNOS Y SÍNTOMAS   Los síntomas pueden durar entre 4 y 10 días y pueden ser:  · Fiebre.  · Escalofríos.  · Dolor de cabeza, dolores en el cuerpo y musculares.  · Dolor de garganta.  · Molestias en el pecho y tos.  · Pérdida del apetito.  · Debilidad o cansancio.  · Mareos.  · Náuseas o vómitos.  DIAGNÓSTICO   El diagnóstico se realiza según su historia clínica y un examen físico. Es necesario realizar un análisis de cultivo faríngeo o nasal para confirmar el diagnóstico.  TRATAMIENTO   En los casos leves, la gripe se cura sin tratamiento. El tratamiento está dirigido a aliviar los síntomas. En los casos más graves, el médico podrá recetar medicamentos antivirales para acortar el curso de la enfermedad. Los antibióticos no son eficaces, ya que la infección está causada por un virus y no una  bacteria.  INSTRUCCIONES PARA EL CUIDADO EN EL HOGAR   · Tome los medicamentos solamente como se lo haya indicado el médico.  · Utilice un humidificador de niebla fría para facilitar la respiración.  · Haga reposo hasta que la temperatura vuelva a ser normal. Generalmente esto lleva entre 3 y 4 días.  · Beba suficiente líquido para mantener la orina clara o de color amarillo pálido.  · Cúbrase la boca y la nariz al toser o estornudar, y lávese las manos muy bien para evitar que se propague el virus.  · Quédese en su casa y no concurra al trabajo o a la escuela hasta que la fiebre haya desaparecido al menos por un día completo.  PREVENCIÓN   La vacunación anual contra la gripe es la mejor manera de evitar enfermarse. Se recomienda ahora de manera rutinaria una vacuna anual contra la gripe a todos los adultos estadounidenses.  SOLICITE ATENCIÓN MÉDICA SI:  · Tiene dolor en el pecho, la tos empeora o tiene más mucosidad.  · Tiene náuseas, vómitos o diarrea.  · La fiebre regresa o empeora.  SOLICITE ATENCIÓN MÉDICA DE INMEDIATO SI:   · Tiene dificultad para respirar, le falta el aire o tiene la piel o las uñas azuladas.  · Presenta dolor intenso o entumecimiento en el cuello.  · Le duele la cabeza de forma repentina o tiene dolor en la cara o el oído.  · Tiene náuseas o vómitos que no puede controlar.  ASEGÚRESE DE QUE:   · Comprende   Document Released: 11/02/2004 Document Revised: 02/13/2014 Elsevier Interactive Patient Education [employer]  Please return immediately if condition worsens. Please contact her primary physician or the physician you were given for referral. If you have any specialist physicians involved in her treatment and plan please also contact them.  Thank you for using [facility].

## 2015-03-28 NOTE — ED Notes (Signed)
Pt states back pain, sore throat, and a cough that started yesterday. Pt states he feels like working has irritated his back along with a cough. Pt presents in NAD at this time. Skin warm, dry, intact at this time.

## 2015-03-28 NOTE — ED Notes (Signed)
Video interpreter used for triage purposes at this time.

## 2015-03-28 NOTE — ED Provider Notes (Signed)
Time Seen: Approximately *0 8:30  I have reviewed the triage notes  Chief Complaint: Back Pain; Cough; and Sore Throat   History of Present Illness: Alvin [name] is a 65 y.o. male who states a diffuse complaints such as a sore throat and some upper back pain and a dry nonproductive cough which started yesterday. He shouldn't states his sore throat started 2 days ago. These felt like he's had a fever but has not checked his temperature at home. History, review of systems, and disposition was performed through a Engineer, structural. Patient denies any chest pain or abdominal pain. No nausea, vomiting, diarrhea. He indicates a non-insulin-dependent diabetic. He states he hasn't checked his blood sugar levels go home.   Past Medical History  Diagnosis Date  . Diabetes mellitus without complication [facility])     Patient Active Problem List   Diagnosis Date Noted  . Right lower quadrant abdominal pain 12/01/2014  . Gallstones 12/01/2014  . Encounter for screening for malignant neoplasm of colon 01/24/2013  . Abdominal bloating 01/24/2013    Past Surgical History  Procedure Laterality Date  . Pancreatic cyst excision      Past Surgical History  Procedure Laterality Date  . Pancreatic cyst excision      Current Outpatient Rx  Name  Route  Sig  Dispense  Refill  . allopurinol (ZYLOPRIM) 300 MG tablet   Oral   Take 300 mg by mouth.         Marland Kitchen azithromycin (ZITHROMAX Z-PAK) 250 MG tablet      Take 2 tablets (500 mg) on  Day 1,  followed by 1 tablet (250 mg) once daily on Days 2 through 5.   6 each   0   . cetirizine (ZYRTEC) 10 MG tablet   Oral   Take 10 mg by mouth.         . fluticasone (FLONASE) 50 MCG/ACT nasal spray   Each Nare   Place 2 sprays into both nostrils daily.         Marland Kitchen glipiZIDE (GLUCOTROL) 5 MG tablet   Oral   Take 5 mg by mouth 2 (two) times daily before a meal.          . oseltamivir (TAMIFLU) 75 MG capsule   Oral   Take 1 capsule (75 mg  total) by mouth 2 (two) times daily.   10 capsule   0   . sulfacetamide (BLEPH-10) 10 % ophthalmic solution   Both Eyes   Place 2 drops into both eyes 4 (four) times daily.   5 mL   0     Allergies:  Review of patient's allergies indicates no known allergies.  Family History: Family History  Problem Relation Age of Onset  . Alcohol abuse Neg Hx   . Arthritis Neg Hx   . Asthma Neg Hx   . Birth defects Neg Hx   . Cancer Neg Hx   . COPD Neg Hx   . Depression Neg Hx   . Diabetes Neg Hx   . Drug abuse Neg Hx   . Early death Neg Hx   . Hearing loss Neg Hx   . Heart disease Neg Hx   . Hyperlipidemia Neg Hx   . Hypertension Neg Hx   . Kidney disease Neg Hx   . Learning disabilities Neg Hx   . Mental illness Neg Hx   . Mental retardation Neg Hx   . Stroke Neg Hx   . Miscarriages / Stillbirths Neg  Hx   . Vision loss Neg Hx   . Varicose Veins Neg Hx     Social History: Social History  Substance Use Topics  . Smoking status: Never Smoker   . Smokeless tobacco: Never Used  . Alcohol Use: 0.0 oz/week    0 Standard drinks or equivalent per week     Comment: occas     Review of Systems:   10 point review of systems was performed and was otherwise negative:  Constitutional: No fever Eyes: No visual disturbances ENT: Sore throat mentioned above Cardiac: No chest pain Respiratory: No shortness of breath, wheezing, or stridor Abdomen: No abdominal pain, no vomiting, No diarrhea Endocrine: No weight loss, No night sweats Extremities: No peripheral edema, cyanosis Skin: No rashes, easy bruising Neurologic: No focal weakness, trouble with speech or swollowing Urologic: No dysuria, Hematuria, or urinary frequency   Physical Exam:  ED Triage Vitals  Enc Vitals Group     BP 03/28/15 0803 154/73 mmHg     Pulse Rate 03/28/15 0803 91     Resp 03/28/15 0803 22     Temp 03/28/15 0803 99 F (37.2 C)     Temp Source 03/28/15 1050 Oral     SpO2 03/28/15 0803 93 %      Weight 03/28/15 0803 168 lb (76.204 kg)     Height 03/28/15 0803 5' 5.75" (1.67 m)     Head Cir --      Peak Flow --      Pain Score 03/28/15 0805 8     Pain Loc --      Pain Edu? --      Excl. in GC? --     General: Awake , Alert , and Oriented times 3; GCS 15 Head: Normal cephalic , atraumatic Eyes: Pupils equal , round, reactive to light Nose/Throat: No nasal drainage, patent upper airway without erythema or exudate.  Neck: Supple, Full range of motion, No anterior adenopathy or palpable thyroid masses Lungs: Clear to ascultation without wheezes , rhonchi, or rales Heart: Regular rate, regular rhythm without murmurs , gallops , or rubs Abdomen: Soft, non tender without rebound, guarding , or rigidity; bowel sounds positive and symmetric in all 4 quadrants. No organomegaly .        Extremities: 2 plus symmetric pulses. No edema, clubbing or cyanosis Neurologic: normal ambulation, Motor symmetric without deficits, sensory intact Skin: warm, dry, no rashes   Labs:   All laboratory work was reviewed including any pertinent negatives or positives listed below:  Labs Reviewed  GLUCOSE, CAPILLARY - Abnormal; Notable for the following:    Glucose-Capillary 149 (*)    All other components within normal limits  CBC WITH DIFFERENTIAL/PLATELET - Abnormal; Notable for the following:    Platelets 90 (*)    Lymphs Abs 0.6 (*)    All other components within normal limits  BASIC METABOLIC PANEL - Abnormal; Notable for the following:    Glucose, Bld 166 (*)    Calcium 8.8 (*)    All other components within normal limits  RAPID INFLUENZA A&B ANTIGENS ([facility] ONLY)   patient's influenza A test was positive   Radiology: *   CLINICAL DATA: Cough and sore throat  EXAM: NECK SOFT TISSUES - 1+ VIEW  COMPARISON: None.  FINDINGS: There is no evidence of retropharyngeal soft tissue swelling or significant epiglottic enlargement. The cervical airway is unremarkable and no radio-opaque  foreign body identified. Two linear densities overlying the chin soft tissues on the lateral  view are not present on the frontal view, indicating that they are external to the patient.  IMPRESSION: Negative.   Electronically Signed By: Delbert Phenix M.D. On: 03/28/2015 10:32          DG Chest 2 View (Final result) Result time: 03/28/15 10:27:35   Final result by Rad Results In Interface (03/28/15 10:27:35)   Narrative:   CLINICAL DATA: Cough and sore throat that started yesterday; inflammation to back as well, pt states he feels like working has irritated his back with the cough ; hx/o diabetes, nonsmoker  EXAM: CHEST 2 VIEW  COMPARISON: 07/07/2004  FINDINGS: The heart is normal in size. No pulmonary edema. Mildly prominent interstitial markings noted, increased since prior study. No focal consolidations or pleural effusions. Mild mid thoracic spondylosis.  IMPRESSION: Mildly prominent interstitial markings possibly related to viral infection.  No focal acute pulmonary abnormality.        I personally reviewed the radiologic studies   P  ED Course: * Patient's stay here was uneventful and he was given shot of Toradol for fever and body aches. Patient appears to have influenza A and due to his history of diabetes I placed him on Tamiflu which might help resolve his symptoms sooner. Patient was advised drink plenty of fluids and was discharged with influenza A instructions through the interpreter    Assessment: Influenza A   Final Clinical Impression:  * Final diagnoses:  Influenza A     Plan: Outpatient management Patient was advised to return immediately if condition worsens. Patient was advised to follow up with their primary care physician or other specialized physicians involved in their outpatient care             Jennye Moccasin, MD 03/28/15 1122

## 2015-03-28 NOTE — ED Notes (Signed)
Pt has mask on on arrival to assigned bed.

## 2015-03-28 NOTE — ED Notes (Signed)
Pt c/o cough and inflammation to the back. Pt resting on stretcher family at bedside.  Interpreter and MD at bedside.

## 2015-06-30 ENCOUNTER — Emergency Department: Payer: Self-pay

## 2015-06-30 ENCOUNTER — Encounter: Payer: Self-pay | Admitting: Emergency Medicine

## 2015-06-30 ENCOUNTER — Emergency Department
Admission: EM | Admit: 2015-06-30 | Discharge: 2015-06-30 | Disposition: A | Discharge status: Home / Self Care | Payer: Self-pay | Attending: Student | Admitting: Student

## 2015-06-30 DIAGNOSIS — K5900 Constipation, unspecified: Secondary | ICD-10-CM | POA: Insufficient documentation

## 2015-06-30 DIAGNOSIS — Z7984 Long term (current) use of oral hypoglycemic drugs: Secondary | ICD-10-CM | POA: Insufficient documentation

## 2015-06-30 DIAGNOSIS — Z79899 Other long term (current) drug therapy: Secondary | ICD-10-CM | POA: Insufficient documentation

## 2015-06-30 DIAGNOSIS — Z792 Long term (current) use of antibiotics: Secondary | ICD-10-CM | POA: Insufficient documentation

## 2015-06-30 DIAGNOSIS — I10 Essential (primary) hypertension: Secondary | ICD-10-CM | POA: Insufficient documentation

## 2015-06-30 DIAGNOSIS — E119 Type 2 diabetes mellitus without complications: Secondary | ICD-10-CM | POA: Insufficient documentation

## 2015-06-30 DIAGNOSIS — R1033 Periumbilical pain: Secondary | ICD-10-CM

## 2015-06-30 LAB — URINALYSIS COMPLETE WITH MICROSCOPIC (ARMC ONLY)
BACTERIA UA: NONE SEEN
Bilirubin Urine: NEGATIVE
HGB URINE DIPSTICK: NEGATIVE
Ketones, ur: NEGATIVE mg/dL
LEUKOCYTES UA: NEGATIVE
Nitrite: NEGATIVE
PH: 5 (ref 5.0–8.0)
Protein, ur: NEGATIVE mg/dL
Specific Gravity, Urine: 1.014 (ref 1.005–1.030)

## 2015-06-30 LAB — COMPREHENSIVE METABOLIC PANEL
ALBUMIN: 4.6 g/dL (ref 3.5–5.0)
ALT: 43 U/L (ref 17–63)
AST: 35 U/L (ref 15–41)
Alkaline Phosphatase: 78 U/L (ref 38–126)
Anion gap: 7 (ref 5–15)
BUN: 13 mg/dL (ref 6–20)
CHLORIDE: 103 mmol/L (ref 101–111)
CO2: 28 mmol/L (ref 22–32)
Calcium: 9.4 mg/dL (ref 8.9–10.3)
Creatinine, Ser: 0.72 mg/dL (ref 0.61–1.24)
GFR calc Af Amer: >60 mL/min (ref >60)
GLUCOSE: 210 mg/dL — AB (ref 65–99)
POTASSIUM: 4.3 mmol/L (ref 3.5–5.1)
SODIUM: 138 mmol/L (ref 135–145)
Total Bilirubin: 0.7 mg/dL (ref 0.3–1.2)
Total Protein: 7.5 g/dL (ref 6.5–8.1)

## 2015-06-30 LAB — TROPONIN I

## 2015-06-30 LAB — CBC
HEMATOCRIT: 46.1 % (ref 40.0–52.0)
Hemoglobin: 15.9 g/dL (ref 13.0–18.0)
MCH: 31 pg (ref 26.0–34.0)
MCHC: 34.6 g/dL (ref 32.0–36.0)
MCV: 89.6 fL (ref 80.0–100.0)
Platelets: 111 10*3/uL — ABNORMAL LOW (ref 150–440)
RBC: 5.14 MIL/uL (ref 4.40–5.90)
RDW: 13.4 % (ref 11.5–14.5)
WBC: 4.3 10*3/uL (ref 3.8–10.6)

## 2015-06-30 LAB — LIPASE, BLOOD: LIPASE: 14 U/L (ref 11–51)

## 2015-06-30 MED ORDER — GI COCKTAIL ~~LOC~~
30.0000 mL | Freq: Once | ORAL | Status: AC
  Administered 2015-06-30: 30 mL via ORAL
  Filled 2015-06-30: qty 30

## 2015-06-30 MED ORDER — POLYETHYLENE GLYCOL 3350 17 GM/SCOOP PO POWD: ORAL | Status: DC

## 2015-06-30 NOTE — ED Provider Notes (Signed)
[facility] Emergency Department Provider Note   ____________________________________________  Time seen: Approximately 9:17 AM  I have reviewed the triage vital signs and the nursing notes.   HISTORY  Chief Complaint Abdominal Pain  Caveat-history of present illness and review of systems was limited due to the Spanish language barrier which was overcome with the use of the [place] interpreter, Alvin [name].  HPI Alvin [name] is [name] 65 y.o. male with history of diabetes, hypertension, cholelithiasis, GERD, followed by [facility] gastroenterology for abdominal pain which frequently resolves after walking and is thought to be secondary to indigestion, history of colonoscopy and patient reports "9 cysts were removed" but per the GI notes, these were just diminutive polyps, who presents for evaluation of 4 days burning periumbilical pain which is worse when he wakes up in the morning and typically resolves after [name] walk/throughout the day, gradual onset, moderate, currently very mild. Patient reports he has had this pain on and off for several years. He is also had subjective fevers and chills over the past 4 days". No nausea, vomiting, diarrhea, pain or burning with urination. No chest pain or difficulty breathing.  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 Diabetes mellitus without complication [facility])     Patient Active Problem List   Diagnosis Date Noted  . Right lower quadrant abdominal pain 12/01/2014  . Gallstones 12/01/2014  . Encounter for screening for malignant neoplasm of colon 01/24/2013  . Abdominal bloating 01/24/2013    Past Surgical History  Procedure Laterality Date  . Pancreatic cyst excision      Current Outpatient Rx  Name  Route  Sig  Dispense  Refill  . allopurinol (ZYLOPRIM) 300 MG tablet   Oral   Take 300 mg by  mouth.         Marland Kitchen azithromycin (ZITHROMAX Z-PAK) 250 MG tablet      Take 2 tablets (500 mg) on  Day 1,  followed by 1 tablet (250 mg) once daily on Days 2 through 5.   6 each   0   . cetirizine (ZYRTEC) 10 MG tablet   Oral   Take 10 mg by mouth.         . fluticasone (FLONASE) 50 MCG/ACT nasal spray   Each Nare   Place 2 sprays into both nostrils daily.         Marland Kitchen glipiZIDE (GLUCOTROL) 5 MG tablet   Oral   Take 5 mg by mouth 2 (two) times daily before [name] meal.          . oseltamivir (TAMIFLU) 75 MG capsule   Oral   Take 1 capsule (75 mg total) by mouth 2 (two) times daily.   10 capsule   0   . polyethylene glycol powder (GLYCOLAX/MIRALAX) powder      Dissolve 1 tablespoon in 4-8 ounces of juice or water and drink once daily.   255 g   0   . sulfacetamide (BLEPH-10) 10 % ophthalmic solution   Both Eyes   Place 2 drops into both eyes 4 (four) times daily.   5 mL   0     Allergies Review of patient's allergies indicates no known allergies.  Family History  Problem Relation Age of Onset  . Alcohol abuse Neg Hx   . Arthritis Neg Hx   . Asthma Neg Hx   . Birth  defects Neg Hx   . Cancer Neg Hx   . COPD Neg Hx   . Depression Neg Hx   . Diabetes Neg Hx   . Drug abuse Neg Hx   . Early death Neg Hx   . Hearing loss Neg Hx   . Heart disease Neg Hx   . Hyperlipidemia Neg Hx   . Hypertension Neg Hx   . Kidney disease Neg Hx   . Learning disabilities Neg Hx   . Mental illness Neg Hx   . Mental retardation Neg Hx   . Stroke Neg Hx   . Miscarriages / Stillbirths Neg Hx   . Vision loss Neg Hx   . Varicose Veins Neg Hx     Social History Social History  Substance Use Topics  . Smoking status: Never Smoker   . Smokeless tobacco: Never Used  . Alcohol Use: 0.0 oz/week    0 Standard drinks or equivalent per week     Comment: occas    Review of Systems Constitutional: +subjective fever/chills Eyes: No visual changes. ENT: No sore  throat. Cardiovascular: Denies chest pain. Respiratory: Denies shortness of breath. Gastrointestinal: + abdominal pain.  No nausea, no vomiting.  No diarrhea.  No constipation. Genitourinary: Negative for dysuria. Musculoskeletal: Negative for back pain. Skin: Negative for rash. Neurological: Negative for headaches, focal weakness or numbness.  10-point ROS otherwise negative.  ____________________________________________   PHYSICAL EXAM:  VITAL SIGNS: ED Triage Vitals  Enc Vitals Group     BP 06/30/15 0855 151/88 mmHg     Pulse Rate 06/30/15 0855 74     Resp 06/30/15 0855 20     Temp 06/30/15 0855 98 F (36.7 C)     Temp Source 06/30/15 0855 Oral     SpO2 06/30/15 0855 96 %     Weight 06/30/15 0855 218 lb (98.884 kg)     Height 06/30/15 0855  (1.676 m)     Head Cir --      Peak Flow --      Pain Score 06/30/15 0857 4     Pain Loc --      Pain Edu? --      Excl. in GC? --     Constitutional: Alert and oriented. Well appearing and in no acute distress. Eyes: Conjunctivae are normal. PERRL. EOMI. Head: Atraumatic. Nose: No congestion/rhinnorhea. Mouth/Throat: Mucous membranes are moist.  Oropharynx non-erythematous. Neck: No stridor. Supple without meningismus. Cardiovascular: Normal rate, regular rhythm. Grossly normal heart sounds.  Good peripheral circulation. Respiratory: Normal respiratory effort.  No retractions. Lungs CTAB. Gastrointestinal: Soft and nontender. No distention.  No CVA tenderness. Genitourinary: deferred Musculoskeletal: No lower extremity tenderness nor edema.  No joint effusions. Neurologic:  Normal speech and language. No gross focal neurologic deficits are appreciated. No gait instability. Skin:  Skin is warm, dry and intact. No rash noted. Psychiatric: Mood and affect are normal. Speech and behavior are normal.  ____________________________________________   LABS (all labs ordered are listed, but only abnormal results are  displayed)  Labs Reviewed  COMPREHENSIVE METABOLIC PANEL - Abnormal; Notable for the following:    Glucose, Bld 210 (*)    All other components within normal limits  CBC - Abnormal; Notable for the following:    Platelets 111 (*)    All other components within normal limits  URINALYSIS COMPLETEWITH MICROSCOPIC ([facility] ONLY) - Abnormal; Notable for the following:    Color, Urine YELLOW (*)    APPearance CLEAR (*)  Glucose, UA >500 (*)    Squamous Epithelial / LPF 0-5 (*)    All other components within normal limits  LIPASE, BLOOD  TROPONIN I   ____________________________________________  EKG  ED ECG REPORT I, Alvin [name], Alvin [name], the attending physician, personally viewed and interpreted this ECG.   Date: 06/30/2015  EKG Time: 09:14  Rate: 73  Rhythm: normal sinus rhythm  Axis: normal  Intervals:right bundle branch block  ST&T Change: No acute ST elevation.  ____________________________________________  RADIOLOGY  3 view abdominal plain films ____________________________________________   PROCEDURES  Procedure(s) performed: None  Critical Care performed: No  ____________________________________________   INITIAL IMPRESSION / ASSESSMENT AND PLAN / ED COURSE  Pertinent labs & imaging results that were available during my care of the patient were reviewed by me and considered in my medical decision making (see chart for details).  Alvin [name] is [name] 65 y.o. male with history of diabetes, hypertension, cholelithiasis, GERD, [facility] for abdominal pain which frequently resolves after walking and is thought to be secondary to indigestion, history of colonoscopy and patient reports "9 cysts were removed" but per the GI notes, these were just diminutive polyps, who presents for evaluation of 4 days burning periumbilical pain which is worse when he wakes up in the morning and typically resolves after [name] walk/throughout the day. He currently reports  his pain is very mild. On exam, he is very well-appearing and in no acute distress. His vital signs are stable, he is afebrile. He has normal bowel sounds and benign abdominal examination. EKG not consistent with acute ischemia. CBC, CMP, lipase unremarkable, unremarkable urinalysis. Awaiting troponin and 3 view of the abdomen. I suspect his pain is secondary to his pain which is thought to be secondary to indigestion. He does have [name] history of gallstones but no right upper quadrant pain or evidence of biliary obstruction on his labs. We'll give [name] GI cocktail and reassess for disposition.  ----------------------------------------- 12:53 PM on 06/30/2015 -----------------------------------------  Patient with complete resolution of his pain. He reports he feels well. Labs reviewed. CBC, CMP, lipase unremarkable, negative troponin. Urinalysis is not consistent with infection. Three-view of the abdomen shows increased stool burden without ileus or gastroenteritis. We'll DC with [facility], close PCP and [facility] GI follow-up. Discussed return precautions, need for close follow-up, all questions were answered with the assistance of Spanish interpreter and the patient is comfortable with the discharge plan. ____________________________________________   FINAL CLINICAL IMPRESSION(S) / ED DIAGNOSES  Final diagnoses:  Periumbilical abdominal pain  Constipation, unspecified constipation type      NEW MEDICATIONS STARTED DURING THIS VISIT:  Discharge Medication List as of 06/30/2015 12:56 PM    START taking these medications   Details  polyethylene glycol powder (GLYCOLAX/MIRALAX) powder Dissolve 1 tablespoon in 4-8 ounces of juice or water and drink once daily., Print         Note:  This document was prepared using Dragon voice recognition software and may include unintentional dictation errors.    Alvin DossEryka [name] Bryonna Sundby, MD 06/30/15 1538

## 2015-06-30 NOTE — ED Notes (Signed)
Pt from home with "burning" in the abdomen and generalized weakness. This happened a year or two ago as well, and was seen by several physicians. Pt states he had 9 cysts removed from the stomach after ultrasound and colonoscopy. Pt states it started 4 days ago and is worse in the mornings when he awakens. Pt alert & oriented with NAD noted.

## 2015-07-12 ENCOUNTER — Emergency Department
Admission: EM | Admit: 2015-07-12 | Discharge: 2015-07-12 | Disposition: A | Discharge status: Home / Self Care | Payer: Medicare Other | Attending: Emergency Medicine | Admitting: Emergency Medicine

## 2015-07-12 ENCOUNTER — Encounter: Payer: Self-pay | Admitting: Emergency Medicine

## 2015-07-12 ENCOUNTER — Emergency Department: Payer: Medicare Other

## 2015-07-12 DIAGNOSIS — F410 Panic disorder [episodic paroxysmal anxiety] without agoraphobia: Secondary | ICD-10-CM

## 2015-07-12 DIAGNOSIS — F419 Anxiety disorder, unspecified: Secondary | ICD-10-CM | POA: Insufficient documentation

## 2015-07-12 DIAGNOSIS — R109 Unspecified abdominal pain: Secondary | ICD-10-CM

## 2015-07-12 DIAGNOSIS — Z79899 Other long term (current) drug therapy: Secondary | ICD-10-CM | POA: Insufficient documentation

## 2015-07-12 DIAGNOSIS — Z7984 Long term (current) use of oral hypoglycemic drugs: Secondary | ICD-10-CM | POA: Insufficient documentation

## 2015-07-12 DIAGNOSIS — E119 Type 2 diabetes mellitus without complications: Secondary | ICD-10-CM | POA: Diagnosis not present

## 2015-07-12 DIAGNOSIS — Z792 Long term (current) use of antibiotics: Secondary | ICD-10-CM | POA: Diagnosis not present

## 2015-07-12 DIAGNOSIS — R42 Dizziness and giddiness: Secondary | ICD-10-CM | POA: Diagnosis present

## 2015-07-12 LAB — URINALYSIS COMPLETE WITH MICROSCOPIC (ARMC ONLY)
Bilirubin Urine: NEGATIVE
HGB URINE DIPSTICK: NEGATIVE
Ketones, ur: NEGATIVE mg/dL
LEUKOCYTES UA: NEGATIVE
NITRITE: NEGATIVE
PROTEIN: NEGATIVE mg/dL
SPECIFIC GRAVITY, URINE: 1.021 (ref 1.005–1.030)
pH: 6 (ref 5.0–8.0)

## 2015-07-12 LAB — COMPREHENSIVE METABOLIC PANEL
ALBUMIN: 4.6 g/dL (ref 3.5–5.0)
ALT: 42 U/L (ref 17–63)
AST: 34 U/L (ref 15–41)
Alkaline Phosphatase: 83 U/L (ref 38–126)
Anion gap: 10 (ref 5–15)
BUN: 18 mg/dL (ref 6–20)
CHLORIDE: 101 mmol/L (ref 101–111)
CO2: 25 mmol/L (ref 22–32)
CREATININE: 0.65 mg/dL (ref 0.61–1.24)
Calcium: 8.9 mg/dL (ref 8.9–10.3)
GFR calc Af Amer: >60 mL/min (ref >60)
GLUCOSE: 204 mg/dL — AB (ref 65–99)
POTASSIUM: 3.9 mmol/L (ref 3.5–5.1)
Sodium: 136 mmol/L (ref 135–145)
Total Bilirubin: 0.9 mg/dL (ref 0.3–1.2)
Total Protein: 7.4 g/dL (ref 6.5–8.1)

## 2015-07-12 LAB — LIPASE, BLOOD

## 2015-07-12 LAB — CBC
HEMATOCRIT: 44.7 % (ref 40.0–52.0)
Hemoglobin: 15.5 g/dL (ref 13.0–18.0)
MCH: 31 pg (ref 26.0–34.0)
MCHC: 34.5 g/dL (ref 32.0–36.0)
MCV: 89.7 fL (ref 80.0–100.0)
Platelets: 112 10*3/uL — ABNORMAL LOW (ref 150–440)
RBC: 4.99 MIL/uL (ref 4.40–5.90)
RDW: 13.3 % (ref 11.5–14.5)
WBC: 4.1 10*3/uL (ref 3.8–10.6)

## 2015-07-12 LAB — TROPONIN I

## 2015-07-12 MED ORDER — IOPAMIDOL (ISOVUE-300) INJECTION 61%
100.0000 mL | Freq: Once | INTRAVENOUS | Status: AC | PRN
  Administered 2015-07-12: 100 mL via INTRAVENOUS

## 2015-07-12 MED ORDER — DIATRIZOATE MEGLUMINE & SODIUM 66-10 % PO SOLN
15.0000 mL | Freq: Once | ORAL | Status: AC
  Administered 2015-07-12: 15 mL via ORAL

## 2015-07-12 MED ORDER — CITALOPRAM HYDROBROMIDE 20 MG PO TABS: 20.0000 mg | ORAL_TABLET | Freq: Every day | ORAL | Status: DC

## 2015-07-12 NOTE — ED Notes (Signed)
Pt discharged home after verbalizing understanding of discharge instructions; nad noted. 

## 2015-07-12 NOTE — ED Provider Notes (Signed)
[facility] Emergency Department Provider Note   ____________________________________________  Time seen: Approximately 3:22 PM  I have reviewed the triage vital signs and the nursing notes.   HISTORY  Chief Complaint Dizziness    HPI Alvin [name] is a 65 y.o. male who comes in history obtained through interpreter. Patient says he has a headache and some dizziness in the morning when he is laying down when he first wakes up in his belly also is uncomfortable. This resolves after he gets up and walks around the park. Patient was here several days ago for the same thing had acute abdominal series which only showed a lot of stool. He comes back because is really no better. Released the dizziness is no better. The abdominal pain may have been somewhat better after GI cocktail is difficult to tell he is very vague. Patient gives me a long history of having had the same symptoms in [place] 14 years ago he had abdominal surgery and then and then had to see a psychiatrist and got better after that he's not had trouble for many years but now is having similar symptoms he thinks. Patient also thinks he may be anxious. I asked Dr. Allie Bossier PACS to see him. He is a psychiatrist here in the hospital today. He feels the patient is having anxiety and we'll give her a prescription for Celexa. Patient's CT scans are still not back and waiting on them. I will sign the patient out to Dr. Derrill Kay.   Past Medical History  Diagnosis Date  . Diabetes mellitus without complication [facility])     Patient Active Problem List   Diagnosis Date Noted  . Right lower quadrant abdominal pain 12/01/2014  . Gallstones 12/01/2014  . Encounter for screening for malignant neoplasm of colon 01/24/2013  . Abdominal bloating 01/24/2013    Past Surgical History  Procedure Laterality Date  . Pancreatic cyst excision      Current Outpatient Rx  Name  Route  Sig  Dispense  Refill  . allopurinol  (ZYLOPRIM) 300 MG tablet   Oral   Take 300 mg by mouth.         Marland Kitchen azithromycin (ZITHROMAX Z-PAK) 250 MG tablet      Take 2 tablets (500 mg) on  Day 1,  followed by 1 tablet (250 mg) once daily on Days 2 through 5.   6 each   0   . cetirizine (ZYRTEC) 10 MG tablet   Oral   Take 10 mg by mouth.         . citalopram (CELEXA) 20 MG tablet   Oral   Take 1 tablet (20 mg total) by mouth daily.   30 tablet   1   . fluticasone (FLONASE) 50 MCG/ACT nasal spray   Each Nare   Place 2 sprays into both nostrils daily.         Marland Kitchen glipiZIDE (GLUCOTROL) 5 MG tablet   Oral   Take 5 mg by mouth 2 (two) times daily before a meal.          . oseltamivir (TAMIFLU) 75 MG capsule   Oral   Take 1 capsule (75 mg total) by mouth 2 (two) times daily.   10 capsule   0   . polyethylene glycol powder (GLYCOLAX/MIRALAX) powder      Dissolve 1 tablespoon in 4-8 ounces of juice or water and drink once daily.   255 g   0   . sulfacetamide (BLEPH-10) 10 %  ophthalmic solution   Both Eyes   Place 2 drops into both eyes 4 (four) times daily.   5 mL   0     Allergies Review of patient's allergies indicates no known allergies.  Family History  Problem Relation Age of Onset  . Alcohol abuse Neg Hx   . Arthritis Neg Hx   . Asthma Neg Hx   . Birth defects Neg Hx   . Cancer Neg Hx   . COPD Neg Hx   . Depression Neg Hx   . Diabetes Neg Hx   . Drug abuse Neg Hx   . Early death Neg Hx   . Hearing loss Neg Hx   . Heart disease Neg Hx   . Hyperlipidemia Neg Hx   . Hypertension Neg Hx   . Kidney disease Neg Hx   . Learning disabilities Neg Hx   . Mental illness Neg Hx   . Mental retardation Neg Hx   . Stroke Neg Hx   . Miscarriages / Stillbirths Neg Hx   . Vision loss Neg Hx   . Varicose Veins Neg Hx     Social History Social History  Substance Use Topics  . Smoking status: Never Smoker   . Smokeless tobacco: Never Used  . Alcohol Use: 0.0 oz/week    0 Standard drinks or  equivalent per week     Comment: occas    Review of Systems Constitutional: No fever/chills Eyes: No visual changes. ENT: No sore throat. Cardiovascular: Denies chest pain. Respiratory: Denies shortness of breath. GastrointestinalSee history of present illness Genitourinary: Negative for dysuria. Musculoskeletal: Negative for back pain. Skin: Negative for rash. Neurological: Negative for focal weakness or numbness.  10-point ROS otherwise negative.  ____________________________________________   PHYSICAL EXAM:  VITAL SIGNS: ED Triage Vitals  Enc Vitals Group     BP 07/12/15 1113 147/94 mmHg     Pulse Rate 07/12/15 1113 75     Resp 07/12/15 1113 20     Temp 07/12/15 1113 98.1 F (36.7 C)     Temp Source 07/12/15 1113 Oral     SpO2 07/12/15 1113 95 %     Weight 07/12/15 1113 218 lb (98.884 kg)     Height 07/12/15 1113 5\' 7"  (1.702 m)     Head Cir --      Peak Flow --      Pain Score 07/12/15 1116 0     Pain Loc --      Pain Edu? --      Excl. in GC? --     Constitutional: Alert and oriented. Well appearing and in no acute distress. Eyes: Conjunctivae are normal. PERRL. EOMI. Head: Atraumatic. Nose: No congestion/rhinnorhea. Mouth/Throat: Mucous membranes are moist.  Oropharynx non-erythematous. Neck: No stridor.  Cardiovascular: Normal rate, regular rhythm. Grossly normal heart sounds.  Good peripheral circulation. Respiratory: Normal respiratory effort.  No retractions. Lungs CTAB. Gastrointestinal: Soft and nontender. No distention. No abdominal bruits. No CVA tenderness. Musculoskeletal: No lower extremity tenderness nor edema.  No joint effusions. Neurologic:  Normal speech and language. No gross focal neurologic deficits are appreciated. No nerves II through XII are intact. Cerebellar finger-nose rapid alternating movement in the fingers are normal. Motor strength is 5 over 5 throughout. No gait instability. Skin:  Skin is warm, dry and intact. No rash  noted.   ____________________________________________   LABS (all labs ordered are listed, but only abnormal results are displayed)  Labs Reviewed  LIPASE, BLOOD - Abnormal; Notable for  the following:    Lipase <10 (*)    All other components within normal limits  COMPREHENSIVE METABOLIC PANEL - Abnormal; Notable for the following:    Glucose, Bld 204 (*)    All other components within normal limits  CBC - Abnormal; Notable for the following:    Platelets 112 (*)    All other components within normal limits  URINALYSIS COMPLETEWITH MICROSCOPIC ([facility] ONLY) - Abnormal; Notable for the following:    Color, Urine YELLOW (*)    APPearance CLEAR (*)    Glucose, UA >500 (*)    Bacteria, UA RARE (*)    Squamous Epithelial / LPF 0-5 (*)    All other components within normal limits  TROPONIN I   ____________________________________________  [facility] and interpreted by me shows normal sinus rhythm rate of 71 right bundle-branch block normal axis no acute ST-T wave changes no change from previous EKGs ____________________________________________  RADIOLOGY  Awaiting CT report ____________________________________________   PROCEDURES    ____________________________________________   INITIAL IMPRESSION / ASSESSMENT AND PLAN / ED COURSE  Pertinent labs & imaging results that were available during my care of the patient were reviewed by me and considered in my medical decision making (see chart for details).   ____________________________________________   FINAL CLINICAL IMPRESSION(S) / ED DIAGNOSES  Final diagnoses:  Dizziness  Abdominal discomfort  Anxiety      NEW MEDICATIONS STARTED DURING THIS VISIT:  Current Discharge Medication List    START taking these medications   Details  citalopram (CELEXA) 20 MG tablet Take 1 tablet (20 mg total) by mouth daily. Qty: 30 tablet, Refills: 1         Note:  This document was prepared using Dragon voice  recognition software and may include unintentional dictation errors.    Arnaldo NatalPaul F Mychaela Lennartz, MD 07/12/15 [phone]

## 2015-07-12 NOTE — BH Assessment (Signed)
Assessment Note  Alvin [name] is an 65 y.o. male. Who voluntarily presents to the ED seeking a psychiatric evaluation. Pt is currently living with his wife and young daughter. 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 Pt reports a history of abdominal surgery, x15 years ago.Pt states that following surgery he began to have bouts of lightheadedness, burning pain throughout his abdomen and weakness in his lower extremities. Pt states that at that time he began to see a psychiatrist in [place] that prescribed him an unknown medication. Pt states that he was told that the medication would take about to weeks to take effect. Pt reports that the medication was effective and he was able to stop taking it after 6 months or so. Pt reports having an additional surgery. Pt states that had 9 cysts removed from his abdomen x1.5 years ago and he has now began to have similar sx. Pt states that these pains have woken him out of his sleep for the past 10 days. Pt reports that the pains subside after about 30 minutes. Pt denies and previous MH inpatient admissions. Pts denies the use of any mood altering substances. Pt. denies any suicidal ideation, plan or intent. Pt. denies the presence of any auditory or visual hallucinations at this time. Patient denies any other medical complaints.   Diagnosis: Anxiety Disorder    Past Medical History:  Past Medical History  Diagnosis Date  . Diabetes mellitus without complication [facility])     Past Surgical History  Procedure Laterality Date  . Pancreatic cyst excision      Family History:  Family History  Problem Relation Age of Onset  . Alcohol abuse Neg Hx   . Arthritis Neg Hx   . Asthma Neg Hx   . Birth defects Neg Hx   . Cancer Neg Hx   . COPD Neg Hx   . Depression Neg Hx   . Diabetes Neg Hx   . Drug abuse Neg Hx   . Early death Neg Hx   . Hearing loss Neg Hx   . Heart  disease Neg Hx   . Hyperlipidemia Neg Hx   . Hypertension Neg Hx   . Kidney disease Neg Hx   . Learning disabilities Neg Hx   . Mental illness Neg Hx   . Mental retardation Neg Hx   . Stroke Neg Hx   . Miscarriages / Stillbirths Neg Hx   . Vision loss Neg Hx   . Varicose Veins Neg Hx     Social History:  reports that he has never smoked. He has never used smokeless tobacco. He reports that he drinks alcohol. He reports that he does not use illicit drugs.  Additional Social History:  Alcohol / Drug Use Pain Medications: See PTA  Prescriptions: See PTA  Over the Counter: See PTA  History of alcohol / drug use?: No history of alcohol / drug abuse  CIWA: CIWA-Ar BP: (!) 147/94 mmHg Pulse Rate: 75 COWS:    Allergies: No Known Allergies  Home Medications:  (Not in a hospital admission)  OB/GYN Status:  No LMP for male patient.  General Assessment Data Location of Assessment: [facility] ED TTS Assessment: In system Is this a Tele or Face-to-Face Assessment?: Face-to-Face Is this an Initial Assessment or a Re-assessment for this encounter?: Initial Assessment Marital status: Married Is patient pregnant?: No Pregnancy Status: No Living Arrangements: Spouse/significant other,  Children Can pt return to current living arrangement?: Yes Admission Status: Voluntary Is patient capable of signing voluntary admission?: Yes Referral Source: Self/Family/Friend Insurance type: None  Medical Screening Exam [facility] Walk-in ONLY) Medical Exam completed: Yes  Crisis Care Plan Living Arrangements: Spouse/significant other, Children Legal Guardian: Other: Name of Psychiatrist: None  Name of Therapist: None   Education Status Is patient currently in school?: No Current Grade: N/A Highest grade of school patient has completed: Unknown  Name of school: N/A Contact person: N/A  Risk to self with the past 6 months Suicidal Ideation: No Has patient been a risk to self within the past 6  months prior to admission? : No Suicidal Intent: No Has patient had any suicidal intent within the past 6 months prior to admission? : No Is patient at risk for suicide?: No Suicidal Plan?: No Has patient had any suicidal plan within the past 6 months prior to admission? : No Access to Means: No What has been your use of drugs/alcohol within the last 12 months?: None Previous Attempts/Gestures: No How many times?: 0 Other Self Harm Risks: 0 Triggers for Past Attempts: Other (Comment) Intentional Self Injurious Behavior: None Family Suicide History: No Recent stressful life event(s): Other (Comment) (None noted) Persecutory voices/beliefs?: No Depression: No Depression Symptoms:  (None) Substance abuse history and/or treatment for substance abuse?: No Suicide prevention information given to non-admitted patients: Not applicable  Risk to Others within the past 6 months Homicidal Ideation: No Does patient have any lifetime risk of violence toward others beyond the six months prior to admission? : No Thoughts of Harm to Others: No Current Homicidal Intent: No Current Homicidal Plan: No Access to Homicidal Means: No History of harm to others?: No Assessment of Violence: None Noted Does patient have access to weapons?: No Criminal Charges Pending?: No Does patient have a court date: No Is patient on probation?: No  Psychosis Hallucinations: None noted Delusions: None noted  Mental Status Report Appearance/Hygiene: In scrubs Eye Contact: Fair Motor Activity: Freedom of movement Speech: Logical/coherent Level of Consciousness: Alert Mood: Pleasant Affect: Appropriate to circumstance Anxiety Level: None Thought Processes: Coherent Judgement: Unimpaired Orientation: Place, Person, Time, Situation Obsessive Compulsive Thoughts/Behaviors: None  Cognitive Functioning Concentration: Normal Memory: Recent Intact, Remote Intact IQ: Average Insight: Fair Impulse Control:  Fair Appetite: Fair Weight Loss: 0 Weight Gain: 0 Sleep: No Change Total Hours of Sleep: 8 Vegetative Symptoms: None  ADLScreening [facility] Assessment Services) Patient's cognitive ability adequate to safely complete daily activities?: Yes Patient able to express need for assistance with ADLs?: Yes Independently performs ADLs?: Yes (appropriate for developmental age)  Prior Inpatient Therapy Prior Inpatient Therapy: No Prior Therapy Dates: N/A Prior Therapy Facilty/Provider(s): N/A Reason for Treatment: N/A  Prior Outpatient Therapy Prior Outpatient Therapy: No Prior Therapy Dates: N/A Prior Therapy Facilty/Provider(s): N/A Reason for Treatment: N/A Does patient have an ACCT team?: No Does patient have Intensive In-House Services?  : No Does patient have [facility] services? : No Does patient have P4CC services?: No  ADL Screening (condition at time of admission) Patient's cognitive ability adequate to safely complete daily activities?: Yes Patient able to express need for assistance with ADLs?: Yes Independently performs ADLs?: Yes (appropriate for developmental age)       Abuse/Neglect Assessment (Assessment to be complete while patient is alone) Physical Abuse: Denies Verbal Abuse: Denies Sexual Abuse: Denies Exploitation of patient/patient's resources: Denies Self-Neglect: Denies Values / Beliefs Cultural Requests During Hospitalization: None Spiritual Requests During Hospitalization: None Consults Spiritual Care  Consult Needed: No Social Work Consult 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

## 2015-07-12 NOTE — Consult Note (Signed)
[facility] Face-to-Face Psychiatry Consult   Reason for Consult:  65 year old man who came into the emergency room for complaints of intermittent abdominal discomfort and dizziness Referring Physician:  Cinda Quest Patient Identification: Alvin [name] MRN:  [id number] Principal Diagnosis: Panic attack Diagnosis:   Patient Active Problem List   Diagnosis Date Noted  . Panic attack [F41.0] 07/12/2015  . Right lower quadrant abdominal pain [R10.31] 12/01/2014  . Gallstones [K80.20] 12/01/2014  . Encounter for screening for malignant neoplasm of colon [Z12.11] 01/24/2013  . Abdominal bloating [R14.0] 01/24/2013    Total Time spent with patient: 1 hour  Subjective:   Alvin [name] is a 65 y.o. male patient admitted with "in the morning when I'm sleeping I get this feeling in my stomach".  HPI:  Patient interviewed with the assistance of hospital provided Spanish language interpreter. Case discussed with emergency room attending. Labs in chart reviewed. 65 year old man came to the emergency room complaining that for the past 10 days he has been having symptoms early in the morning. It sounds like they happen before he even wakes up in the morning that he will have a uncomfortable feeling in his stomach like he is bloated with gas. He will also feel dizziness in his head. He says after he gets up and moves around for about a half an hour the symptoms get better. Sounds like he is sleeping reasonably well at night. He is not feeling depressed. Patient is not able to identify a specific new stressor and is not feeling particularly anxious. Completely denies any suicidal ideation. No evidence of any psychotic symptoms. He is not abusing alcohol or drugs by his report. He does give a past history of having similar symptoms several years ago when he was in [place]. At that time he was treated by a psychiatrist with what sounds like antidepressant medicine with a good outcome.  Social history: Patient is  married lives with his wife and daughter. He had been working at [employer] but since college has been out he has been laid off. Not currently working. He indicates that he doesn't feel that worried about it. Thinking about going down to [place] for the summer.  Medical history: He has diabetes. He seems to indicate that he thinks he's taking care of it pretty well although his blood sugars were high here in the emergency room. He says he takes oral medication Debby Bud regularly that he gets from the clinic. What pressure is also high here but he doesn't give a history of having had hypertension. Patient indicates that he's had abdominal surgery in the past. It wasn't really clear what that was but it sounds like it happened many years ago and that issue had been resolved. Possibly related to his gallbladder.  Substance abuse history: Says he drinks beer only one or 2 times a week and doesn't drink to excess. Doesn't indicate he has an alcohol problem. Denies other drug abuse.  Past Psychiatric History: No history of psychiatric hospitalization. No history of suicide attempt. Judging from his description of it it sounds like when he had similar types of symptoms 15 years ago in [place] he saw a psychiatrist and was treated with a pill which she was told would take a couple weeks before it started to work. I am going to guess that means it was an antidepressant. He felt like it helped and it sounds like he took it for a couple months at most and then stopped it. Hasn't had any psychiatric treatment in the  interval.  Risk to Self: Is patient at risk for suicide?: No Risk to Others:   Prior Inpatient Therapy:   Prior Outpatient Therapy:    Past Medical History:  Past Medical History  Diagnosis Date  . Diabetes mellitus without complication [facility])     Past Surgical History  Procedure Laterality Date  . Pancreatic cyst excision     Family History:  Family History  Problem Relation Age of Onset  .  Alcohol abuse Neg Hx   . Arthritis Neg Hx   . Asthma Neg Hx   . Birth defects Neg Hx   . Cancer Neg Hx   . COPD Neg Hx   . Depression Neg Hx   . Diabetes Neg Hx   . Drug abuse Neg Hx   . Early death Neg Hx   . Hearing loss Neg Hx   . Heart disease Neg Hx   . Hyperlipidemia Neg Hx   . Hypertension Neg Hx   . Kidney disease Neg Hx   . Learning disabilities Neg Hx   . Mental illness Neg Hx   . Mental retardation Neg Hx   . Stroke Neg Hx   . Miscarriages / Stillbirths Neg Hx   . Vision loss Neg Hx   . Varicose Veins Neg Hx    Family Psychiatric  History: Patient is not aware of any family history of mental illness Social History:  History  Alcohol Use  . 0.0 oz/week  . 0 Standard drinks or equivalent per week    Comment: occas     History  Drug Use No    Social History   Social History  . Marital Status: Married    Spouse Name: N/A  . Number of Children: N/A  . Years of Education: N/A   Social History Main Topics  . Smoking status: Never Smoker   . Smokeless tobacco: Never Used  . Alcohol Use: 0.0 oz/week    0 Standard drinks or equivalent per week     Comment: occas  . Drug Use: No  . Sexual Activity: Not Asked   Other Topics Concern  . None   Social History Narrative   Additional Social History:    Allergies:  No Known Allergies  Labs:  Results for orders placed or performed during the hospital encounter of 07/12/15 (from the past 48 hour(s))  Urinalysis complete, with microscopic     Status: Abnormal   Collection Time: 07/12/15 11:19 AM  Result Value Ref Range   Color, Urine YELLOW (A) YELLOW   APPearance CLEAR (A) CLEAR   Glucose, UA >500 (A) NEGATIVE mg/dL   Bilirubin Urine NEGATIVE NEGATIVE   Ketones, ur NEGATIVE NEGATIVE mg/dL   Specific Gravity, Urine 1.021 1.005 - 1.030   Hgb urine dipstick NEGATIVE NEGATIVE   pH 6.0 5.0 - 8.0   Protein, ur NEGATIVE NEGATIVE mg/dL   Nitrite NEGATIVE NEGATIVE   Leukocytes, UA NEGATIVE NEGATIVE   RBC /  HPF 0-5 0 - 5 RBC/hpf   WBC, UA 0-5 0 - 5 WBC/hpf   Bacteria, UA RARE (A) NONE SEEN   Squamous Epithelial / LPF 0-5 (A) NONE SEEN   Mucous PRESENT   Lipase, blood     Status: Abnormal   Collection Time: 07/12/15 11:36 AM  Result Value Ref Range   Lipase <10 (L) 11 - 51 U/L  Comprehensive metabolic panel     Status: Abnormal   Collection Time: 07/12/15 11:36 AM  Result Value Ref Range   Sodium  136 135 - 145 mmol/L   Potassium 3.9 3.5 - 5.1 mmol/L   Chloride 101 101 - 111 mmol/L   CO2 25 22 - 32 mmol/L   Glucose, Bld 204 (H) 65 - 99 mg/dL   BUN 18 6 - 20 mg/dL   Creatinine, Ser 0.65 0.61 - 1.24 mg/dL   Calcium 8.9 8.9 - 10.3 mg/dL   Total Protein 7.4 6.5 - 8.1 g/dL   Albumin 4.6 3.5 - 5.0 g/dL   AST 34 15 - 41 U/L   ALT 42 17 - 63 U/L   Alkaline Phosphatase 83 38 - 126 U/L   Total Bilirubin 0.9 0.3 - 1.2 mg/dL   GFR calc non Af Amer >60 >60 mL/min   GFR calc Af Amer >60 >60 mL/min    Comment: (NOTE) The eGFR has been calculated using the CKD EPI equation. This calculation has not been validated in all clinical situations. eGFR's persistently <60 mL/min signify possible Chronic Kidney Disease.    Anion gap 10 5 - 15  CBC     Status: Abnormal   Collection Time: 07/12/15 11:36 AM  Result Value Ref Range   WBC 4.1 3.8 - 10.6 K/uL   RBC 4.99 4.40 - 5.90 MIL/uL   Hemoglobin 15.5 13.0 - 18.0 g/dL   HCT 44.7 40.0 - 52.0 %   MCV 89.7 80.0 - 100.0 fL   MCH 31.0 26.0 - 34.0 pg   MCHC 34.5 32.0 - 36.0 g/dL   RDW 13.3 11.5 - 14.5 %   Platelets 112 (L) 150 - 440 K/uL  Troponin I     Status: None   Collection Time: 07/12/15 11:36 AM  Result Value Ref Range   Troponin I <0.03 <0.031 ng/mL    Comment:        NO INDICATION OF MYOCARDIAL INJURY.     No current facility-administered medications for this encounter.   Current Outpatient Prescriptions  Medication Sig Dispense Refill  . allopurinol (ZYLOPRIM) 300 MG tablet Take 300 mg by mouth.    Marland Kitchen azithromycin (ZITHROMAX Z-PAK)  250 MG tablet Take 2 tablets (500 mg) on  Day 1,  followed by 1 tablet (250 mg) once daily on Days 2 through 5. 6 each 0  . cetirizine (ZYRTEC) 10 MG tablet Take 10 mg by mouth.    . citalopram (CELEXA) 20 MG tablet Take 1 tablet (20 mg total) by mouth daily. 30 tablet 1  . fluticasone (FLONASE) 50 MCG/ACT nasal spray Place 2 sprays into both nostrils daily.    Marland Kitchen glipiZIDE (GLUCOTROL) 5 MG tablet Take 5 mg by mouth 2 (two) times daily before a meal.     . oseltamivir (TAMIFLU) 75 MG capsule Take 1 capsule (75 mg total) by mouth 2 (two) times daily. 10 capsule 0  . polyethylene glycol powder (GLYCOLAX/MIRALAX) powder Dissolve 1 tablespoon in 4-8 ounces of juice or water and drink once daily. 255 g 0  . sulfacetamide (BLEPH-10) 10 % ophthalmic solution Place 2 drops into both eyes 4 (four) times daily. 5 mL 0    Musculoskeletal: Strength & Muscle Tone: within normal limits Gait & Station: normal Patient leans: N/A  Psychiatric Specialty Exam: Physical Exam  Nursing note and vitals reviewed. Constitutional: He appears well-developed and well-nourished.  HENT:  Head: Normocephalic and atraumatic.  Eyes: Conjunctivae are normal. Pupils are equal, round, and reactive to light.  Neck: Normal range of motion.  Cardiovascular: Normal heart sounds.   Respiratory: Effort normal. No respiratory distress.  GI:  Soft.  Musculoskeletal: Normal range of motion.  Neurological: He is alert.  Skin: Skin is warm and dry.  Psychiatric: He has a normal mood and affect. His speech is normal and behavior is normal. Judgment and thought content normal. Cognition and memory are normal.    Review of Systems  Constitutional: Negative.   HENT: Negative.   Eyes: Negative.   Respiratory: Negative.   Cardiovascular: Negative.   Gastrointestinal: Positive for abdominal pain.  Musculoskeletal: Negative.   Skin: Negative.   Neurological: Positive for dizziness.  Psychiatric/Behavioral: Negative for depression,  suicidal ideas, hallucinations, memory loss and substance abuse. The patient is not nervous/anxious and does not have insomnia.     Blood pressure 147/94, pulse 75, temperature 98.1 F (36.7 C), temperature source Oral, resp. rate 20, height _0  (1.702 m), weight 98.884 kg (218 lb), SpO2 95 %.Body mass index is 34.14 kg/(m^2).  General Appearance: Casual  Eye Contact:  Good  Speech:  Clear and Coherent  Volume:  Normal  Mood:  Euthymic  Affect:  Congruent  Thought Process:  Goal Directed  Orientation:  Full (Time, Place, and Person)  Thought Content:  Logical  Suicidal Thoughts:  No  Homicidal Thoughts:  No  Memory:  Immediate;   Good Recent;   Fair Remote;   Fair  Judgement:  Fair  Insight:  Fair  Psychomotor Activity:  Normal  Concentration:  Concentration: Fair  Recall:  [employer] of Knowledge:  Fair  Language:  Fair  Akathisia:  No  Handed:  Right  AIMS (if indicated):     Assets:  Communication Skills Desire for Improvement Financial Resources/Insurance Housing Physical Health Resilience Social Support  ADL's:  Intact  Cognition:  WNL  Sleep:        Treatment Plan Summary: Plan Based on the history this gentleman is giving my best guess is that he is having panic attack equivalents. These frequently happen early in the morning and can wake people up from sleep. He is not reporting any specific stressor bringing this on. Doesn't seem to be having depression or anxiety symptoms throughout the rest of the day. I offered to try restarting him on a serotonin reuptake inhibitor which should help with the symptoms particularly if it helped in the past. Patient will be given a prescription for citalopram 20 mg a day. I've given him a 30 day prescription and put a refill on it as well. I have encouraged him to follow-up with his regular medical provider especially if the symptoms worsen. No indication for psychiatric hospitalization. Patient is agreeable to the plan. Case  reviewed with emergency room doctor. Prescription written.  Disposition: Patient does not meet criteria for psychiatric inpatient admission. Supportive therapy provided about ongoing stressors.  Alethia Berthold, MD 07/12/2015 4:06 PM

## 2015-07-12 NOTE — ED Notes (Signed)
Pt returns to the ED c/o dizziness and pain early in the morning upon awakening that lasts 30-60 minutes and then goes away. He was seen here and treated with GI cocktail and given miralax for constipation (denies that he was constipated) but that the pain/dizzness return. He states that he is perfectly fine now but is seeking treatment for his early morning dizziness and pain. Pt alert & oriented. NAD noted.

## 2015-07-12 NOTE — Discharge Instructions (Signed)
Please seek medical attention for any high fevers, chest pain, shortness of breath, change in behavior, persistent vomiting, bloody stool or any other new or concerning symptoms.  Hernia - Adultos (Hernia, Adult) Una hernia ocurre cuando un rgano o un tejido interno se protruye a travs de un punto debilitado del vientre (abdomen). CUIDADOS EN EL HOGAR  No estire ni use en exceso (sobrecargue) los msculos que estn cerca de la hernia.  No levante ningn objeto que pese ms de 10libras (4,5kg).  Para levantar objetos, use los msculos de las piernas. No use los msculos de la espalda.  Cuando tosa, hgalo con suavidad.  Consuma una dieta con alto contenido de [place] Springsfibra. Coma gran cantidad de frutas y verduras.  Beba suficiente lquido para mantener el pis (orina) claro o de color amarillo plido. Trate de beber 6 u 8vasos de Warehouse manageragua por da.  Tome medicamentos para ablandar la materia fecal (ablandadores de heces) como se lo haya indicado el mdico.  Baje de [place]o, si tiene sobrepeso.  No consuma ningn producto que contenga tabaco, lo que incluye cigarrillos, tabaco de Theatre managermascar o Administrator, Civil Servicecigarrillos electrnicos. Si necesita ayuda para dejar de fumar, consulte al mdico.  Concurra a todas las visitas de control como se lo haya indicado el mdico. Esto es importante. SOLICITE AYUDA SI:  La piel que rodea la hernia se inflama (hincha) o se enrojece.  Le duele la hernia. SOLICITE AYUDA DE INMEDIATO SI:  Tiene fiebre.  Siente dolor abdominal que empeora.  Tiene malestar estomacal (nuseas) o vomita.  No puede volver a Electrical engineercolocar la hernia en su lugar al ejercer sobre esta una presin suave mientras est acostado.  La hernia:  Cambia de forma o de tamao.  Se le atasca fuera del vientre.  Cambia de color.  Est dura al tacto o le causa dolor a la palpacin.   Esta informacin no tiene Theme park managercomo fin reemplazar el consejo del mdico. Asegrese de hacerle al mdico cualquier pregunta que  tenga.   Document Released: 11/13/2012 Document Revised: 02/13/2014 Elsevier Interactive Patient Education [employer]2016 Elsevier Inc.

## 2015-07-12 NOTE — ED Provider Notes (Signed)
-----------------------------------------   4:31 PM on 07/12/2015 -----------------------------------------  CT head IMPRESSION: No acute abnormalities.  CT abd/pel IMPRESSION: 1. No bowel obstruction. 2. Small fat containing left paraumbilical hernia.   Patient in no acute distress this time. Will give patient surgery follow-up for fat-containing hernia seen on CT abdomen and pelvis. Will discharge patient with prescription for celexa prepared by Dr. Toni Amendlapacs.  Phineas SemenGraydon Brendon Christoffel, MD 07/12/15 [phone]

## 2015-07-12 NOTE — ED Notes (Addendum)
Dizzyness when awakens in morning for about 10 days. Accompanied by abdominal pain and distention. Lasts for about 30 minutes after he gets up. Denies SOB or chest pain.

## 2015-07-30 ENCOUNTER — Encounter: Payer: Self-pay | Admitting: Surgery

## 2015-07-30 ENCOUNTER — Ambulatory Visit (INDEPENDENT_AMBULATORY_CARE_PROVIDER_SITE_OTHER): Payer: Self-pay | Admitting: Surgery

## 2015-07-30 VITALS — BP 149/84 | HR 76 | Temp 98.3°F | Ht 63.0 in | Wt 228.0 lb

## 2015-07-30 DIAGNOSIS — R1013 Epigastric pain: Secondary | ICD-10-CM

## 2015-07-30 DIAGNOSIS — K219 Gastro-esophageal reflux disease without esophagitis: Secondary | ICD-10-CM

## 2015-07-30 NOTE — Progress Notes (Signed)
Patient ID: Alvin [name], male   DOB: 01-30-1951, 65 y.o.   MRN: [id number]  History of Present Illness Alvin Mccallumntonio Mandujano is a 65 y.o. male with some diffuse abdominal discomofor for the last month. Patient reports that is actually a burning sensation deep in his abdomen. Reports that wakes him from his sleep and and actually denies any abdominal pain. He does have early satiety and some abdominal distention. This is been present for the last month or so. Some nausea. He also described some dizziness, some weakness and some tremors. Spur of the workup a CT scan was performed and a half personally reviewed showing a small incisional hernia around the umbilicus but no other acute into abdominal pathology of note the pancreas is completely normal and there is no evidence of pancreatitis or previous pancreatic manipulation. And also part of his workup has included an ultrasound showing stones without evidence of cholecystitis He denies any right upper quadrant pain recently. At some point in time over a year ago he did have some symptomatic cholelithiasis but they have not bother him anymore. The discomfort that he currently feels is completely different than the one he fail from his gallstones a couple of years ago  Past Medical History Past Medical History  Diagnosis Date  . 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 ( pt has not had any pancreatic surgery) Confirmed with the pt   No Known Allergies  Current Outpatient Prescriptions  Medication Sig Dispense Refill  . citalopram (CELEXA) 20 MG tablet Take 1 tablet (20 mg total) by mouth daily. 30 tablet 1  . glipiZIDE (GLUCOTROL) 5 MG tablet Take 5 mg by mouth 2 (two) times daily before a meal.     . sulfacetamide (BLEPH-10) 10 % ophthalmic solution Place 2 drops into both eyes 4 (four) times daily. 5 mL 0   No current facility-administered medications for this visit.    Family  History Family History  Problem Relation Age of Onset  . Alcohol abuse Neg Hx   . Arthritis Neg Hx   . Asthma Neg Hx   . Birth defects Neg Hx   . Cancer Neg Hx   . COPD Neg Hx   . Depression Neg Hx   . Diabetes Neg Hx   . Drug abuse Neg Hx   . Early death Neg Hx   . Hearing [name] Neg Hx   . Heart disease Neg Hx   . Hyperlipidemia Neg Hx   . Hypertension Neg Hx   . Kidney disease Neg Hx   . Learning disabilities Neg Hx   . Mental illness Neg Hx   . Mental retardation Neg Hx   . Stroke Neg Hx   . Miscarriages / Stillbirths Neg Hx   . Vision [name] Neg Hx   . Varicose Veins Neg Hx      Social History Social History  Substance Use Topics  . Smoking status: Never Smoker   . Smokeless tobacco: Never Used  . Alcohol Use: 0.0 oz/week    0 Standard drinks or equivalent per week     Comment: occas      ROS 10 pt ROS is negative  Physical Exam Blood pressure 149/84, pulse 76, temperature 98.3 F (36.8 C), temperature source Oral, height 5\' 3"  (1.6 m), weight 103.42 kg (228 lb).  CONSTITUTIONAL: NAD EYES: Pupils equal, round, and reactive to light, Sclera non-icteric. EARS, NOSE, MOUTH AND THROAT: The oropharynx  is clear. Oral mucosa is pink and moist. Hearing is intact to voice.  NECK: Trachea is midline, and there is no jugular venous distension. Thyroid is without palpable abnormalities. LYMPH NODES:  Lymph nodes in the neck are not enlarged. RESPIRATORY:  Lungs are clear, and breath sounds are equal bilaterally. Normal respiratory effort without pathologic use of accessory muscles. CARDIOVASCULAR: Heart is regular without murmurs, gallops, or rubs. GI: The abdomen is  soft, nontender, and nondistended. There were no palpable masses. There was no hepatosplenomegaly. There were normal bowel sounds. There is a 2.5 cm incisional hernia on lower portion of midline laparotomy MUSCULOSKELETAL:  Normal muscle strength and tone in all four extremities.    SKIN: Skin turgor is  normal. There are no pathologic skin lesions.  NEUROLOGIC:  Motor and sensation is grossly normal.  Cranial nerves are grossly intact. PSYCH:  Alert and oriented to person, place and time. Affect is normal.  Data Reviewed  I have personally reviewed the patient's imaging and medical records.    Assessment/Plan Abdominal pain: This is the filling not related to his gallstones and not related to the incisional hernia. Since that might be related to gastritis or reflux. We will arrange for EGD by Dr.Wohl  Incisional hernia: No evidence of incarceration or symptoms. I do want to figure out the cause of his abdominal pain before repairing this hernia. I do think that some point in time we'll need to repair this incisional hernia to prevent any incarceration or strangulation  Gallstones currently asymptomatic and no surgery is indicated at this time  F/U 3 weeks  Radie Berges, MD FACS  Destenee Guerry F Cathyrn Deas 07/30/2015, 1:14 PM

## 2015-08-04 ENCOUNTER — Other Ambulatory Visit: Payer: Self-pay

## 2015-08-18 ENCOUNTER — Encounter: Payer: Self-pay | Admitting: *Deleted

## 2015-08-19 ENCOUNTER — Ambulatory Visit: Payer: Medicare Other | Admitting: Anesthesiology

## 2015-08-19 ENCOUNTER — Ambulatory Visit
Admission: RE | Admit: 2015-08-19 | Discharge: 2015-08-19 | Disposition: A | Discharge status: Home / Self Care | Payer: Medicare Other | Source: Ambulatory Visit | Attending: Gastroenterology | Admitting: Gastroenterology

## 2015-08-19 ENCOUNTER — Encounter
Admission: RE | Disposition: A | Discharge status: Home / Self Care | Payer: Self-pay | Source: Ambulatory Visit | Attending: Gastroenterology

## 2015-08-19 DIAGNOSIS — F419 Anxiety disorder, unspecified: Secondary | ICD-10-CM | POA: Insufficient documentation

## 2015-08-19 DIAGNOSIS — K259 Gastric ulcer, unspecified as acute or chronic, without hemorrhage or perforation: Secondary | ICD-10-CM | POA: Insufficient documentation

## 2015-08-19 DIAGNOSIS — Z79899 Other long term (current) drug therapy: Secondary | ICD-10-CM | POA: Insufficient documentation

## 2015-08-19 DIAGNOSIS — K219 Gastro-esophageal reflux disease without esophagitis: Secondary | ICD-10-CM | POA: Diagnosis not present

## 2015-08-19 DIAGNOSIS — R12 Heartburn: Secondary | ICD-10-CM | POA: Insufficient documentation

## 2015-08-19 DIAGNOSIS — I10 Essential (primary) hypertension: Secondary | ICD-10-CM | POA: Diagnosis not present

## 2015-08-19 DIAGNOSIS — K2970 Gastritis, unspecified, without bleeding: Secondary | ICD-10-CM | POA: Insufficient documentation

## 2015-08-19 DIAGNOSIS — R1013 Epigastric pain: Secondary | ICD-10-CM | POA: Diagnosis not present

## 2015-08-19 DIAGNOSIS — K254 Chronic or unspecified gastric ulcer with hemorrhage: Secondary | ICD-10-CM | POA: Diagnosis not present

## 2015-08-19 DIAGNOSIS — E119 Type 2 diabetes mellitus without complications: Secondary | ICD-10-CM | POA: Insufficient documentation

## 2015-08-19 HISTORY — PX: ESOPHAGOGASTRODUODENOSCOPY (EGD) WITH PROPOFOL: SHX5813

## 2015-08-19 HISTORY — DX: Essential (primary) hypertension: I10

## 2015-08-19 HISTORY — DX: Anxiety disorder, unspecified: F41.9

## 2015-08-19 LAB — GLUCOSE, CAPILLARY: GLUCOSE-CAPILLARY: 165 mg/dL — AB (ref 65–99)

## 2015-08-19 SURGERY — ESOPHAGOGASTRODUODENOSCOPY (EGD) WITH PROPOFOL
Anesthesia: Monitor Anesthesia Care | Wound class: Clean Contaminated

## 2015-08-19 MED ORDER — GLYCOPYRROLATE 0.2 MG/ML IJ SOLN
INTRAMUSCULAR | Status: DC | PRN
  Administered 2015-08-19: 0.2 mg via INTRAVENOUS

## 2015-08-19 MED ORDER — PROPOFOL 10 MG/ML IV BOLUS
INTRAVENOUS | Status: DC | PRN
  Administered 2015-08-19: 100 mg via INTRAVENOUS
  Administered 2015-08-19: 30 mg via INTRAVENOUS
  Administered 2015-08-19 (×2): 50 mg via INTRAVENOUS

## 2015-08-19 MED ORDER — ONDANSETRON HCL 4 MG/2ML IJ SOLN: 4.0000 mg | Freq: Once | INTRAMUSCULAR | Status: DC | PRN

## 2015-08-19 MED ORDER — LACTATED RINGERS IV SOLN
INTRAVENOUS | Status: DC
  Administered 2015-08-19: 08:00:00 via INTRAVENOUS

## 2015-08-19 MED ORDER — ACETAMINOPHEN 325 MG PO TABS: 325.0000 mg | ORAL_TABLET | ORAL | Status: DC | PRN

## 2015-08-19 MED ORDER — STERILE WATER FOR IRRIGATION IR SOLN
Status: DC | PRN
  Administered 2015-08-19: 09:00:00

## 2015-08-19 MED ORDER — PANTOPRAZOLE SODIUM 40 MG PO TBEC: 40.0000 mg | DELAYED_RELEASE_TABLET | Freq: Every day | ORAL | Status: AC

## 2015-08-19 MED ORDER — ACETAMINOPHEN 160 MG/5ML PO SOLN: 325.0000 mg | ORAL | Status: DC | PRN

## 2015-08-19 MED ORDER — LIDOCAINE HCL (CARDIAC) 20 MG/ML IV SOLN
INTRAVENOUS | Status: DC | PRN
  Administered 2015-08-19: 40 mg via INTRAVENOUS

## 2015-08-19 SURGICAL SUPPLY — 32 items

## 2015-08-19 NOTE — Anesthesia Postprocedure Evaluation (Signed)
Anesthesia Post Note  Patient: Alvin [name]  Procedure(s) Performed: Procedure(s) (LRB): ESOPHAGOGASTRODUODENOSCOPY (EGD) WITH PROPOFOL (N/A)  Patient location during evaluation: PACU Anesthesia Type: MAC Level of consciousness: awake and alert and oriented Pain management: pain level controlled Vital Signs Assessment: post-procedure vital signs reviewed and stable Respiratory status: spontaneous breathing and nonlabored ventilation Cardiovascular status: stable Postop Assessment: no signs of nausea or vomiting and adequate PO intake Anesthetic complications: no    Harolyn RutherfordJoshua Yussuf Sawyers

## 2015-08-19 NOTE — Op Note (Signed)
[facility]astroenterology Patient Name: Alvin [name] Procedure Date: 08/19/2015 8:08 AM MRN: [id number] Account #: [id number] Date of Birth: [birth year] Admit Type: Outpatient Age: 65 Room: [facility] OR ROOM 01 Gender: Male Note Status: Finalized Procedure:            Upper GI endoscopy Indications:          Epigastric abdominal pain, Heartburn Providers:            Midge Minium, MD Referring MD:         [facility] ***Dimple Nanas (Referring MD) Medicines:            Propofol per Anesthesia Complications:        No immediate complications. Procedure:            Pre-Anesthesia Assessment:                       - Prior to the procedure, a History and Physical was                        performed, and patient medications and allergies were                        reviewed. The patient's tolerance of previous                        anesthesia was also reviewed. The risks and benefits of                        the procedure and the sedation options and risks were                        discussed with the patient. All questions were                        answered, and informed consent was obtained. Prior                        Anticoagulants: The patient has taken no previous                        anticoagulant or antiplatelet agents. ASA Grade                        Assessment: II - A patient with mild systemic disease.                        After reviewing the risks and benefits, the patient was                        deemed in satisfactory condition to undergo the                        procedure.                       After obtaining informed consent, the endoscope was                        passed under direct vision. Throughout the procedure,  the patient's blood pressure, pulse, and oxygen                        saturations were monitored continuously. The Olympus                        GIF H180J endoscope (S#: [id number]) was introduced                         through the mouth, and advanced to the second part of                        duodenum. The upper GI endoscopy was accomplished                        without difficulty. The patient tolerated the procedure                        well. Findings:      The examined esophagus was normal.      One non-bleeding cratered gastric ulcer with pigmented material was       found in the gastric body. This was biopsied with a cold forceps for       histology.      Diffuse severe inflammation characterized by erosions and erythema was       found in the entire examined stomach. Biopsies were taken with a cold       forceps for histology.      The examined duodenum was normal. Impression:           - Normal esophagus.                       - Non-bleeding gastric ulcer with pigmented material.                        Biopsied.                       - Gastritis. Biopsied.                       - Normal examined duodenum. Recommendation:       - Await pathology results. Procedure Code(s):    --- Professional ---                       [phone], Esophagogastroduodenoscopy, flexible, transoral;                        with biopsy, single or multiple Diagnosis Code(s):    --- Professional ---                       R12, Heartburn                       K25.9, Gastric ulcer, unspecified as acute or chronic,                        without hemorrhage or perforation                       K29.70, Gastritis, unspecified, without bleeding  R10.13, Epigastric pain CPT copyright 2016 American Medical Association. All rights reserved. The codes documented in this report are preliminary and upon coder review may  be revised to meet current compliance requirements. Midge Minium, MD 08/19/2015 8:33:00 AM This report has been signed electronically. Number of Addenda: 0 Note Initiated On: 08/19/2015 8:08 AM Total Procedure Duration: 0 hours 6 minutes 41 seconds       [facility]

## 2015-08-19 NOTE — Transfer of Care (Signed)
Immediate Anesthesia Transfer of Care Note  Patient: Alvin [name]  Procedure(s) Performed: Procedure(s): ESOPHAGOGASTRODUODENOSCOPY (EGD) WITH PROPOFOL (N/A)  Patient Location: PACU  Anesthesia Type: MAC  Level of Consciousness: awake, alert  and patient cooperative  Airway and Oxygen Therapy: Patient Spontanous Breathing and Patient connected to supplemental oxygen  Post-op Assessment: Post-op Vital signs reviewed, Patient's Cardiovascular Status Stable, Respiratory Function Stable, Patent Airway and No signs of Nausea or vomiting  Post-op Vital Signs: Reviewed and stable  Complications: No apparent anesthesia complications

## 2015-08-19 NOTE — H&P (Signed)
Alvin Miniumarren Brenden Rudman, MD [facility] [address] [place], [place] [address] Phone: [phone] Fax : [phone]  Primary Care Physician:  [facility] Primary Gastroenterologist:  Dr. Servando SnareWohl  Pre-Procedure History & Physical: HPI:  Alvin [name] is a 65 y.o. male is here for an endoscopy.   Past Medical History  Diagnosis Date  . Diabetes mellitus without complication (HCC)   . Hypertension   . Anxiety     Past Surgical History  Procedure Laterality Date  . Pancreatic cyst excision    . Colon surgery  approx 2007    cyst removed     Prior to Admission medications   Medication Sig Start Date End Date Taking? Authorizing Provider  citalopram (CELEXA) 20 MG tablet Take 1 tablet (20 mg total) by mouth daily. 07/12/15  Yes Audery AmelJohn T Clapacs, MD  glipiZIDE (GLUCOTROL) 5 MG tablet Take 5 mg by mouth 2 (two) times daily before a meal.    Yes Historical Provider, MD  quinapril (ACCUPRIL) 20 MG tablet Take 20 mg by mouth daily.   Yes Historical Provider, MD  sulfacetamide (BLEPH-10) 10 % ophthalmic solution Place 2 drops into both eyes 4 (four) times daily. 02/26/15   Evangeline Dakinharles M Beers, PA-C    Allergies as of 08/04/2015  . (No Known Allergies)    Family History  Problem Relation Age of Onset  . Alcohol abuse Neg Hx   . Arthritis Neg Hx   . Asthma Neg Hx   . Birth defects Neg Hx   . Cancer Neg Hx   . COPD Neg Hx   . Depression Neg Hx   . Diabetes Neg Hx   . Drug abuse Neg Hx   . Early death Neg Hx   . Hearing loss Neg Hx   . Heart disease Neg Hx   . Hyperlipidemia Neg Hx   . Hypertension Neg Hx   . Kidney disease Neg Hx   . Learning disabilities Neg Hx   . Mental illness Neg Hx   . Mental retardation Neg Hx   . Stroke Neg Hx   . Miscarriages / Stillbirths Neg Hx   . Vision loss Neg Hx   . Varicose Veins Neg Hx     Social History   Social History  . Marital Status: Married    Spouse Name: N/A  . Number of Children: N/A  . Years of Education: N/A    Occupational History  . Not on file.   Social History Main Topics  . Smoking status: Never Smoker   . Smokeless tobacco: Never Used  . Alcohol Use: 0.0 oz/week    0 Standard drinks or equivalent per week     Comment: occas  . Drug Use: No  . Sexual Activity: Not on file   Other Topics Concern  . Not on file   Social History Narrative    Review of Systems: See HPI, otherwise negative ROS  Physical Exam: BP 145/86 mmHg  Pulse 73  Temp(Src) 97.3 F (36.3 C) (Temporal)  Ht 5\' 3"  (1.6 m)  Wt 222 lb (100.699 kg)  BMI 39.34 kg/m2  SpO2 96% General:   Alert,  pleasant and cooperative in NAD Head:  Normocephalic and atraumatic. Neck:  Supple; no masses or thyromegaly. Lungs:  Clear throughout to auscultation.    Heart:  Regular rate and rhythm. Abdomen:  Soft, nontender and nondistended. Normal bowel sounds, without guarding, and without rebound.   Neurologic:  Alert and  oriented x4;  grossly normal neurologically.  Impression/Plan: Alvin [name] is here for an endoscopy to be performed for GERD and epigastric pain  Risks, benefits, limitations, and alternatives regarding  endoscopy have been reviewed with the patient.  Questions have been answered.  All parties agreeable.   Alvin Minium, MD  08/19/2015, 8:10 AM

## 2015-08-19 NOTE — Anesthesia Procedure Notes (Signed)
Procedure Name: MAC Performed by: Sharhonda Atwood Pre-anesthesia Checklist: Patient identified, Emergency Drugs available, Suction available, Patient being monitored and Timeout performed Patient Re-evaluated:Patient Re-evaluated prior to inductionOxygen Delivery Method: Nasal cannula       

## 2015-08-19 NOTE — Discharge Instructions (Signed)
Anestesia general, adultos, cuidados posteriores °(General Anesthesia, Adult, Care After) °Siga estas instrucciones durante las próximas semanas. Estas indicaciones le proporcionan información acerca de cómo deberá cuidarse después del procedimiento. El médico también podrá darle instrucciones más específicas. El tratamiento se ha planificado de acuerdo a las prácticas médicas actuales, pero a veces se producen problemas. Comuníquese con el médico si tiene algún problema o tiene dudas después del procedimiento. °QUÉ ESPERAR DESPUÉS DEL PROCEDIMIENTO °Después del procedimiento es habitual experimentar: °· Somnolencia. °· Náuseas y vómitos. °INSTRUCCIONES PARA EL CUIDADO EN EL HOGAR °· Durante las primeras 24 horas luego de la anestesia general: °¨ Haga que una persona responsable se quede con usted. °¨ No conduzca un automóvil. Si está solo, no viaje en transporte público. °¨ No beba alcohol. °¨ No tome medicamentos que no le haya recetado su médico. °¨ No firme documentos importantes ni tome decisiones trascendentes. °¨ Puede reanudar su dieta y sus actividades normales según le haya indicado el médico. °· Cambie los vendajes (apósitos) tal como se le indicó. °· Si tiene preguntas o se le presenta algún problema relacionado con la anestesia general, comuníquese con el hospital y pida por el anestesista o anestesiólogo de guardia. °SOLICITE ATENCIÓN MÉDICA SI: °· Tiene náuseas y vómitos durante el día posterior a la anestesia. °· Le aparece una erupción cutánea. °SOLICITE ATENCIÓN MÉDICA DE INMEDIATO SI:  °· Tiene dificultad para respirar. °· Siente dolor en el pecho. °· Tiene algún problema alérgico. °  °Esta información no tiene como fin reemplazar el consejo del médico. Asegúrese de hacerle al médico cualquier pregunta que tenga. °  °Document Released: 01/23/2005 Document Revised: 02/13/2014 °Elsevier Interactive Patient Education ©2016 Elsevier Inc. ° °

## 2015-08-19 NOTE — Anesthesia Preprocedure Evaluation (Signed)
Anesthesia Evaluation  Patient identified by MRN, date of birth, ID band  Reviewed: Allergy & Precautions, NPO status , Patient's Chart, lab work & pertinent test results  Airway Mallampati: II  TM Distance: >3 FB Neck ROM: Full    Dental no notable dental hx.    Pulmonary neg pulmonary ROS,    Pulmonary exam normal        Cardiovascular hypertension, Normal cardiovascular exam     Neuro/Psych negative neurological ROS     GI/Hepatic negative GI ROS, Neg liver ROS,   Endo/Other  diabetes, Oral Hypoglycemic Agents  Renal/GU      Musculoskeletal negative musculoskeletal ROS (+)   Abdominal   Peds  Hematology negative hematology ROS (+)   Anesthesia Other Findings   Reproductive/Obstetrics                             Anesthesia Physical Anesthesia Plan  ASA: II  Anesthesia Plan: MAC   Post-op Pain Management:    Induction: Intravenous  Airway Management Planned:   Additional Equipment:   Intra-op Plan:   Post-operative Plan:   Informed Consent: I have reviewed the patients History and Physical, chart, labs and discussed the procedure including the risks, benefits and alternatives for the proposed anesthesia with the patient or authorized representative who has indicated his/her understanding and acceptance.     Plan Discussed with: CRNA  Anesthesia Plan Comments:         Anesthesia Quick Evaluation

## 2015-08-20 ENCOUNTER — Encounter: Payer: Self-pay | Admitting: Gastroenterology

## 2015-08-24 ENCOUNTER — Telehealth: Payer: Self-pay

## 2015-08-24 NOTE — Telephone Encounter (Signed)
Patient came into the office this morning to find out what was his next step. He also stated that he was given results but wanted me to go over it once more. Therefore, I looked for his EGD results and gave it to him. Patient stated that he continued to have the symptoms as before. He stated that he continues to have abdominal pain and headaches in the mornings. He also stated that Dr. Servando SnareWohl had given him Pantoprazole and he felt that it wasn't working. I told him that I would call his PCP in reference to his anxiety medication and that I would speak to Dr. Servando SnareWohl about his pantoprazole. I told him that I would give him a call once I had spoken to both of his doctors. Patient agreed.

## 2015-08-25 NOTE — Telephone Encounter (Signed)
Patient called me back and I informed him that I had spoken to Dr. Servando SnareWohl in reference to his medication Pantoprazole. I told him that Dr. Servando SnareWohl wanted him to continue taking his medication once a day and to allow 6-8 weeks to help him. If by then it continued not to help, to please call us back to let us know. I also told him that I called [facility] and spoke with his doctor's nurse about the symptoms he has been experiencing. The nurse gave me an appointment date and time so I could let patient know (08/31/2015 at 1:00 PM). Patient was given this information and understood. He did not have further questions.

## 2015-08-25 NOTE — Telephone Encounter (Signed)
Called patient and had to leave him a voicemail to return my call. 

## 2015-08-26 LAB — SURGICAL PATHOLOGY

## 2015-08-31 ENCOUNTER — Telehealth: Payer: Self-pay

## 2015-08-31 ENCOUNTER — Encounter: Payer: Self-pay | Admitting: Gastroenterology

## 2015-08-31 NOTE — Telephone Encounter (Signed)
The number provided was his sons home number. He lives 3 hours away. His father does not have a phone number but his son will visit him tomorrow. I left a message for [facility] with his son to call me tomorrow to schedule an appointment. I tried calling his work but they were having a difficult time locating him.

## 2015-08-31 NOTE — Telephone Encounter (Signed)
-----   Message from Midge Minium, MD sent at 08/30/2015 12:22 PM EDT ----- Please have the patient come in for a follow up. The biopsies of the stomach showed a precancerous lesions.

## 2015-08-31 NOTE — Telephone Encounter (Signed)
Will you please contact pt and schedule a follow up appt to discuss EGD results? Thank you.

## 2015-09-02 NOTE — Telephone Encounter (Signed)
I scheduled Alvin [name] on 10/12/2015 @ 1:30 with Dr. Servando Snare in the [place] location. Patient is aware of his appointment date and time.

## 2015-09-03 ENCOUNTER — Ambulatory Visit: Payer: Medicare Other | Admitting: Surgery

## 2015-10-07 ENCOUNTER — Other Ambulatory Visit: Payer: Self-pay

## 2015-10-12 ENCOUNTER — Ambulatory Visit: Payer: Self-pay | Admitting: Gastroenterology

## 2015-10-12 ENCOUNTER — Ambulatory Visit: Payer: Medicare Other | Admitting: Gastroenterology

## 2015-11-13 ENCOUNTER — Encounter: Payer: Self-pay | Admitting: Emergency Medicine

## 2015-11-13 ENCOUNTER — Emergency Department
Admission: EM | Admit: 2015-11-13 | Discharge: 2015-11-13 | Disposition: A | Discharge status: Home / Self Care | Payer: Medicare Other | Attending: Emergency Medicine | Admitting: Emergency Medicine

## 2015-11-13 DIAGNOSIS — Z7984 Long term (current) use of oral hypoglycemic drugs: Secondary | ICD-10-CM | POA: Diagnosis not present

## 2015-11-13 DIAGNOSIS — I10 Essential (primary) hypertension: Secondary | ICD-10-CM | POA: Diagnosis not present

## 2015-11-13 DIAGNOSIS — E119 Type 2 diabetes mellitus without complications: Secondary | ICD-10-CM | POA: Diagnosis not present

## 2015-11-13 DIAGNOSIS — H5711 Ocular pain, right eye: Secondary | ICD-10-CM | POA: Diagnosis present

## 2015-11-13 DIAGNOSIS — H538 Other visual disturbances: Secondary | ICD-10-CM | POA: Diagnosis not present

## 2015-11-13 DIAGNOSIS — Z79899 Other long term (current) drug therapy: Secondary | ICD-10-CM | POA: Insufficient documentation

## 2015-11-13 DIAGNOSIS — H539 Unspecified visual disturbance: Secondary | ICD-10-CM

## 2015-11-13 MED ORDER — TETRACAINE HCL 0.5 % OP SOLN
2.0000 [drp] | Freq: Once | OPHTHALMIC | Status: AC
  Administered 2015-11-13: 2 [drp] via OPHTHALMIC

## 2015-11-13 NOTE — ED Notes (Signed)
Pt reports getting steam/water vapor in his eyes last night at work; pt reports decreased visual acuity since then and questions if he got something in his RIGHT eye. Both eyes appear red, pt denies any c/o pain ATT.

## 2015-11-13 NOTE — ED Provider Notes (Signed)
[facility] Emergency Department Provider Note ____________________________________________  Time seen: Approximately 6:20 PM  I have reviewed the triage vital signs and the nursing notes.   HISTORY  Chief Complaint Eye Pain  History, exam, and discharge instructions completed via medical interpreter.  HPI Alvin [name] is a 65 y.o. male who presents to the emergency department for evaluation of right eye pain. He states that yesterday evening he saw bright flashes of light similar to a lightening which lasted about 10 minutes, then noticed black dots in his vision since that time. He has a history of diabetes and hypertension. He is unable to state whether his diabetes is well controlled.  Past Medical History:  Diagnosis Date  . Anxiety   . Diabetes mellitus without complication (HCC)   . Hypertension     Patient Active Problem List   Diagnosis Date Noted  . Heartburn   . Peptic ulcer of stomach   . Gastritis   . Abdominal pain, epigastric   . Panic attack 07/12/2015  . Right lower quadrant abdominal pain 12/01/2014  . Gallstones 12/01/2014  . Encounter for screening for malignant neoplasm of colon 01/24/2013  . Abdominal bloating 01/24/2013    Past Surgical History:  Procedure Laterality Date  . COLON SURGERY  approx 2007   cyst removed   . ESOPHAGOGASTRODUODENOSCOPY (EGD) WITH PROPOFOL N/A 08/19/2015   Procedure: ESOPHAGOGASTRODUODENOSCOPY (EGD) WITH PROPOFOL;  Surgeon: Midge Miniumarren Wohl, MD;  [facility];  Service: Endoscopy;  Laterality: N/A;  . PANCREATIC CYST EXCISION      Prior to Admission medications   Medication Sig Start Date End Date Taking? Authorizing Provider  allopurinol (ZYLOPRIM) 300 MG tablet Take 300 mg by mouth.    Historical Provider, MD  cetirizine (ZYRTEC) 10 MG tablet Take 10 mg by mouth.    Historical Provider, MD  citalopram (CELEXA) 20 MG tablet Take 1 tablet (20 mg total) by mouth daily. 07/12/15    Audery AmelJohn T Clapacs, MD  fluticasone (FLONASE) 50 MCG/ACT nasal spray 1 spray by Each Nare route daily.    Historical Provider, MD  glipiZIDE (GLUCOTROL) 5 MG tablet Take 5 mg by mouth 2 (two) times daily before a meal.     Historical Provider, MD  pantoprazole (PROTONIX) 40 MG tablet Take 1 tablet (40 mg total) by mouth daily before breakfast. 08/19/15   Midge Miniumarren Wohl, MD  quinapril (ACCUPRIL) 20 MG tablet Take 20 mg by mouth daily.    Historical Provider, MD  sulfacetamide (BLEPH-10) 10 % ophthalmic solution Place 2 drops into both eyes 4 (four) times daily. 02/26/15   Evangeline Dakinharles M Beers, PA-C    Allergies Review of patient's allergies indicates no known allergies.  Family History  Problem Relation Age of Onset  . Alcohol abuse Neg Hx   . Arthritis Neg Hx   . Asthma Neg Hx   . Birth defects Neg Hx   . Cancer Neg Hx   . COPD Neg Hx   . Depression Neg Hx   . Diabetes Neg Hx   . Drug abuse Neg Hx   . Early death Neg Hx   . Hearing loss Neg Hx   . Heart disease Neg Hx   . Hyperlipidemia Neg Hx   . Hypertension Neg Hx   . Kidney disease Neg Hx   . Learning disabilities Neg Hx   . Mental illness Neg Hx   . Mental retardation Neg Hx   . Stroke Neg Hx   . Miscarriages / Stillbirths Neg  Hx   . Vision loss Neg Hx   . Varicose Veins Neg Hx     Social History Social History  Substance Use Topics  . Smoking status: Never Smoker  . Smokeless tobacco: Never Used  . Alcohol use 0.0 oz/week     Comment: occas    Review of Systems   Constitutional: No fever/chills Eyes: Positive for visual changes. Positive for pain. Musculoskeletal: Negative for pain. Skin: Negative for rash. Neurological: Negative for headaches, focal weakness or numbness. Allergic: Negative for seasonal allergies. ____________________________________________  PHYSICAL EXAM:  VITAL SIGNS: ED Triage Vitals  Enc Vitals Group     BP 11/13/15 1210 (!) 147/95     Pulse Rate 11/13/15 1210 77     Resp 11/13/15 1210 18      Temp 11/13/15 1210 98.3 F (36.8 C)     Temp Source 11/13/15 1210 Oral     SpO2 11/13/15 1210 97 %     Weight 11/13/15 1211 220 lb (99.8 kg)     Height 11/13/15 1211 5' 6.14" (1.68 m)     Head Circumference --      Peak Flow --      Pain Score 11/13/15 1344 0     Pain Loc --      Pain Edu? --      Excl. in GC? --     Constitutional: Alert and oriented. Well appearing and in no acute distress. Eyes: Visual acuity--see nursing documentation (20/50 throughout); No globe trauma; Eyelids normal to inspection; Sclera appears anicteric.  Eyelids not inverted. Conjunctiva appears erythematous bilaterally; Cornea with pterygium at 3 o'clock. No hyphema. IOP 17/19/22/17. Red reflex present. PERRLA. Head: Atraumatic. Mouth/Throat: Mucous membranes are moist.  Oropharynx non-erythematous. Respiratory: Even and unlabored. Musculoskeletal:Normal ROM x 4 extremities. Neurologic:  Normal speech and language. No gross focal neurologic deficits are appreciated. Speech is normal. No gait instability.  Skin:  Skin is warm, dry and intact. No rash noted. Psychiatric: Mood and affect are normal. Speech and behavior are normal.  ____________________________________________   LABS (all labs ordered are listed, but only abnormal results are displayed)  Labs Reviewed - No data to display ____________________________________________  EKG   ____________________________________________  RADIOLOGY  Not indicated. ____________________________________________   PROCEDURES  Procedure(s) performed: None ____________________________________________   INITIAL IMPRESSION / ASSESSMENT AND PLAN / ED COURSE  Pertinent labs & imaging results that were available during my care of the patient were reviewed by me and considered in my medical decision making (see chart for details).  Clinical Course   Given patient's age and comorbidities, concern for retinal pathology. Case was discussed with  Dr.Vin-Parikh who states that even if there is a partial retinal detachment, she would be unable to take care of it until Monday. She requests that he be nothing by mouth after midnight on Sunday and come to [facility] at 8 AM on Monday morning.  Instructions were explained to the patient via the medical interpreter by myself as well as the discharging nurse. He agrees to the plan and states that he understands instructions and will be NPO after midnight on Sunday.  Strict return precautions were also discussed. ____________________________________________   FINAL CLINICAL IMPRESSION(S) / ED DIAGNOSES  Final diagnoses:  Vision changes    Note:  This document was prepared using Dragon voice recognition software and may include unintentional dictation errors.    Chinita Pester, FNP 11/13/15 1839    Governor Rooks, MD 11/14/15 1536

## 2015-11-13 NOTE — ED Triage Notes (Signed)
Pt c/o seeing a black spot in right eye visual field. Denies pain. Sclera red to both eyes.  Also saw flashes light in right eye. Began after leaving work last night.

## 2015-11-13 NOTE — Discharge Instructions (Signed)
No food or drink after midnight on Sunday. Go to [facility] at 8:00 in the morning on Monday. If the pain in your eye gets worse today or tomorrow, or if you have worsening vision, come back to the ER immediately.  You should also return to the ER if you have nausea or vomiting.

## 2015-11-13 NOTE — ED Notes (Signed)
Visual acuity: RIGHT eye: 20/50 LEFT eye: 20/50 BOTH eyes: 20/50

## 2016-01-07 ENCOUNTER — Telehealth: Payer: Self-pay

## 2016-01-07 NOTE — Telephone Encounter (Signed)
Patient came in the office wanting to let Dr. Servando SnareWohl know that Pantoprazole has not helped him with his gastritis and gastric ulcers. Patient would like to know if he should schedule an appointment or if he could prescribe him something else. I told patient that Dr. Servando SnareWohl was not in the office today but that I would ask him on Monday and then I would call him back once I had a response. Patient understood and had no further questions.

## 2016-01-07 NOTE — Telephone Encounter (Signed)
Patient came back to the office to let me know that in the mornings he feels his abdomen feeling full and distended and this causes some discomfort. Patient also stated that during the day the discomfort gets better but the feeling of fullness stays with him. I told patient that I would mention this to him. Patient understood and had no further questions.

## 2016-01-10 NOTE — Telephone Encounter (Signed)
Patient called wanting to know if Dr. Servando SnareWohl would be able to change his prescription. I told him that Dr. Servando SnareWohl wanted to see him before he did any changes. Patient stated that he was going to the social services office to see if he was approved for Medicaid since he has been paying out of pocket. I told patient to give us a call once he was ready to schedule his appointment. Patient agreed and had no further questions.

## 2016-02-12 ENCOUNTER — Emergency Department
Admission: EM | Admit: 2016-02-12 | Discharge: 2016-02-12 | Disposition: A | Discharge status: Home / Self Care | Payer: Medicare Other | Attending: Emergency Medicine | Admitting: Emergency Medicine

## 2016-02-12 ENCOUNTER — Encounter: Payer: Self-pay | Admitting: Emergency Medicine

## 2016-02-12 DIAGNOSIS — K2950 Unspecified chronic gastritis without bleeding: Secondary | ICD-10-CM

## 2016-02-12 DIAGNOSIS — E119 Type 2 diabetes mellitus without complications: Secondary | ICD-10-CM | POA: Diagnosis not present

## 2016-02-12 DIAGNOSIS — Z7984 Long term (current) use of oral hypoglycemic drugs: Secondary | ICD-10-CM | POA: Diagnosis not present

## 2016-02-12 DIAGNOSIS — I10 Essential (primary) hypertension: Secondary | ICD-10-CM | POA: Insufficient documentation

## 2016-02-12 DIAGNOSIS — R109 Unspecified abdominal pain: Secondary | ICD-10-CM | POA: Diagnosis present

## 2016-02-12 DIAGNOSIS — Z79899 Other long term (current) drug therapy: Secondary | ICD-10-CM | POA: Diagnosis not present

## 2016-02-12 LAB — URINALYSIS, COMPLETE (UACMP) WITH MICROSCOPIC
BACTERIA UA: NONE SEEN
Bilirubin Urine: NEGATIVE
Glucose, UA: >=500 mg/dL — AB
Hgb urine dipstick: NEGATIVE
KETONES UR: NEGATIVE mg/dL
Leukocytes, UA: NEGATIVE
Nitrite: NEGATIVE
PROTEIN: NEGATIVE mg/dL
Specific Gravity, Urine: 1.023 (ref 1.005–1.030)
pH: 5 (ref 5.0–8.0)

## 2016-02-12 NOTE — Discharge Instructions (Signed)
Please seek medical attention for any high fevers, chest pain, shortness of breath, change in behavior, persistent vomiting, bloody stool or any other new or concerning symptoms.  

## 2016-02-12 NOTE — ED Provider Notes (Signed)
[facility] Emergency Department Provider Note   ____________________________________________   I have reviewed the triage vital signs and the nursing notes.   HISTORY  Chief Complaint Abdominal Pain   History limited by: Language [facility] Interpreter utilized   HPI Tc Kapusta is a 66 y.o. male who presents to the emergency department today because of concerns for abdominal pain, dizziness and feelings of inflammation. The symptoms occur in the morning time. They do resolve on their own. At the time my examination the patient states the symptoms are very minimal. They have been going on for months. He did see GI over the summer time and had an EGD which showed gastritis and gastric ulcer. He is no longer taking the medication that was prescribed to him at that time. He has not followed up with his GI doctor since the EGD.   Past Medical History:  Diagnosis Date  . Anxiety   . Diabetes mellitus without complication (HCC)   . Hypertension     Patient Active Problem List   Diagnosis Date Noted  . Heartburn   . Peptic ulcer of stomach   . Gastritis   . Abdominal pain, epigastric   . Panic attack 07/12/2015  . Right lower quadrant abdominal pain 12/01/2014  . Gallstones 12/01/2014  . Encounter for screening for malignant neoplasm of colon 01/24/2013  . Abdominal bloating 01/24/2013    Past Surgical History:  Procedure Laterality Date  . COLON SURGERY  approx 2007   cyst removed   . ESOPHAGOGASTRODUODENOSCOPY (EGD) WITH PROPOFOL N/A 08/19/2015   Procedure: ESOPHAGOGASTRODUODENOSCOPY (EGD) WITH PROPOFOL;  Surgeon: Midge Minium, MD;  Location: [facility];  Service: Endoscopy;  Laterality: N/A;  . PANCREATIC CYST EXCISION      Prior to Admission medications   Medication Sig Start Date End Date Taking? Authorizing Provider  allopurinol (ZYLOPRIM) 300 MG tablet Take 300 mg by mouth.    Historical Provider, MD  cetirizine (ZYRTEC)  10 MG tablet Take 10 mg by mouth.    Historical Provider, MD  citalopram (CELEXA) 20 MG tablet Take 1 tablet (20 mg total) by mouth daily. 07/12/15   Audery Amel, MD  fluticasone (FLONASE) 50 MCG/ACT nasal spray 1 spray by Each Nare route daily.    Historical Provider, MD  glipiZIDE (GLUCOTROL) 5 MG tablet Take 5 mg by mouth 2 (two) times daily before a meal.     Historical Provider, MD  pantoprazole (PROTONIX) 40 MG tablet Take 1 tablet (40 mg total) by mouth daily before breakfast. 08/19/15   Midge Minium, MD  quinapril (ACCUPRIL) 20 MG tablet Take 20 mg by mouth daily.    Historical Provider, MD  sulfacetamide (BLEPH-10) 10 % ophthalmic solution Place 2 drops into both eyes 4 (four) times daily. 02/26/15   Evangeline Dakin, PA-C    Allergies Patient has no known allergies.  Family History  Problem Relation Age of Onset  . Alcohol abuse Neg Hx   . Arthritis Neg Hx   . Asthma Neg Hx   . Birth defects Neg Hx   . Cancer Neg Hx   . COPD Neg Hx   . Depression Neg Hx   . Diabetes Neg Hx   . Drug abuse Neg Hx   . Early death Neg Hx   . Hearing loss Neg Hx   . Heart disease Neg Hx   . Hyperlipidemia Neg Hx   . Hypertension Neg Hx   . Kidney disease Neg Hx   .  Learning disabilities Neg Hx   . Mental illness Neg Hx   . Mental retardation Neg Hx   . Stroke Neg Hx   . Miscarriages / Stillbirths Neg Hx   . Vision loss Neg Hx   . Varicose Veins Neg Hx     Social History Social History  Substance Use Topics  . Smoking status: Never Smoker  . Smokeless tobacco: Never Used  . Alcohol use 0.0 oz/week     Comment: occas    Review of Systems  Constitutional: Negative for fever. Cardiovascular: Negative for chest pain. Respiratory: Negative for shortness of breath. Gastrointestinal: Positive for abdominal pain. Neurological: Negative for headaches, focal weakness or numbness.  10-point ROS otherwise negative.  ____________________________________________   PHYSICAL  EXAM:  VITAL SIGNS: ED Triage Vitals  Enc Vitals Group     BP 02/12/16 1009 135/84     Pulse Rate 02/12/16 1009 86     Resp 02/12/16 1009 20     Temp 02/12/16 1009 98.6 F (37 C)     Temp Source 02/12/16 1009 Oral     SpO2 02/12/16 1009 99 %     Weight 02/12/16 1010 220 lb (99.8 kg)     Height 02/12/16 1010 5' 4.96" (1.65 m)     Head Circumference --      Peak Flow --      Pain Score 02/12/16 1046 5     Pain Loc --    Constitutional: Alert and oriented. Well appearing and in no distress. Eyes: Conjunctivae are normal. Normal extraocular movements. ENT   Head: Normocephalic and atraumatic.   Nose: No congestion/rhinnorhea.   Mouth/Throat: Mucous membranes are moist.   Neck: No stridor. Hematological/Lymphatic/Immunilogical: No cervical lymphadenopathy. Cardiovascular: Normal rate, regular rhythm.  No murmurs, rubs, or gallops.  Respiratory: Normal respiratory effort without tachypnea nor retractions. Breath sounds are clear and equal bilaterally. No wheezes/rales/rhonchi. Gastrointestinal: Soft and non tender. No rebound. No guarding.  Genitourinary: Deferred Musculoskeletal: Normal range of motion in all extremities. No lower extremity edema. Neurologic:  Normal speech and language. No gross focal neurologic deficits are appreciated.  Skin:  Skin is warm, dry and intact. No rash noted. Psychiatric: Mood and affect are normal. Speech and behavior are normal. Patient exhibits appropriate insight and judgment.  ____________________________________________    LABS (pertinent positives/negatives)  None  ____________________________________________   EKG  None  ____________________________________________    RADIOLOGY  None  ____________________________________________   PROCEDURES  Procedures  ____________________________________________   INITIAL IMPRESSION / ASSESSMENT AND PLAN / ED COURSE  Pertinent labs & imaging results that were  available during my care of the patient were reviewed by me and considered in my medical decision making (see chart for details).  Patient with chronic abdominal pain. Hx of gastritis. No longer taking medication. Physical exam is benign. Will discharge to follow up with GI and PCP. Will give patient information on food choices for gastritis.   ____________________________________________   FINAL CLINICAL IMPRESSION(S) / ED DIAGNOSES  Final diagnoses:  Chronic gastritis, presence of bleeding unspecified, unspecified gastritis type     Note: This dictation was prepared with Dragon dictation. Any transcriptional errors that result from this process are unintentional     Phineas SemenGraydon Khalessi Blough, MD 02/12/16 1052

## 2016-02-12 NOTE — ED Triage Notes (Signed)
Pt c/o burning feeling to abd and back when lying flat that goes away when he sits up since May '17. Had EGD done at that time and was prescribed protonix but states it is not helping. States nothing is different today vs in May. Also c/o burning in prostate.

## 2016-02-12 NOTE — ED Notes (Addendum)
Pt c/o pain and swelling in abdomen that has been going on for months. Pt states he is unable to sleep due to the pain. He states he had endoscopy and was prescribed "some pills" and he has not followed up. Pt states he came here today to have us do "some blood tests and use devices" to figure out what is going on. Pt alert & oriented with NAD noted.  Pt reports some improvement for a couple of months when he began taking prescribed meds but it got worse again when he stopped taking meds (about 2 months ago).

## 2016-03-01 ENCOUNTER — Encounter: Payer: Self-pay | Admitting: Gastroenterology

## 2016-03-01 ENCOUNTER — Ambulatory Visit (INDEPENDENT_AMBULATORY_CARE_PROVIDER_SITE_OTHER): Payer: Medicare Other | Admitting: Gastroenterology

## 2016-03-01 ENCOUNTER — Ambulatory Visit: Payer: Medicare Other | Admitting: Gastroenterology

## 2016-03-01 ENCOUNTER — Other Ambulatory Visit: Payer: Self-pay

## 2016-03-01 VITALS — BP 143/81 | HR 83 | Temp 99.1°F | Ht 63.0 in | Wt 222.0 lb

## 2016-03-01 DIAGNOSIS — K3189 Other diseases of stomach and duodenum: Secondary | ICD-10-CM | POA: Diagnosis not present

## 2016-03-01 DIAGNOSIS — K31A0 Gastric intestinal metaplasia, unspecified: Secondary | ICD-10-CM

## 2016-03-01 DIAGNOSIS — R140 Abdominal distension (gaseous): Secondary | ICD-10-CM

## 2016-03-01 DIAGNOSIS — R1013 Epigastric pain: Secondary | ICD-10-CM | POA: Diagnosis not present

## 2016-03-01 NOTE — Progress Notes (Signed)
Primary Care Physician: [facility]  Primary Gastroenterologist:  Dr. Midge Minium  No chief complaint on file.   HPI: Alvin [name] is a 66 y.o. male here due to abdominal pain. 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 The patient had been contacted multiple times to have a follow-up appointment to discuss the pathology results and future plans but did not follow up at the appointment time back in September. The patient's son was called after the patient was unreachable and he informed us after the endoscopy that he would have his father contact us. Despite this the patient still missed his appointment in September. He showed up to the emergency department on January 6 with abdominal pain and was told to follow-up with [place]. The patient also reports that his last colonoscopy was 3 years ago and he was told he needed a repeat colonoscopy in 3 years due to having 9 polyps at that time.  Current Outpatient Prescriptions  Medication Sig Dispense Refill  . allopurinol (ZYLOPRIM) 300 MG tablet Take 300 mg by mouth.    . cetirizine (ZYRTEC) 10 MG tablet Take 10 mg by mouth.    . citalopram (CELEXA) 20 MG tablet Take 1 tablet (20 mg total) by mouth daily. 30 tablet 1  . fluticasone (FLONASE) 50 MCG/ACT nasal spray 1 spray by Each Nare route daily.    Marland Kitchen glipiZIDE (GLUCOTROL) 5 MG tablet Take 5 mg by mouth 2 (two) times daily before a meal.     . pantoprazole (PROTONIX) 40 MG tablet Take 1 tablet (40 mg total) by mouth daily before breakfast. 30 tablet 11  . quinapril (ACCUPRIL) 20 MG tablet Take 20 mg by mouth daily.    Marland Kitchen sulfacetamide (BLEPH-10) 10 % ophthalmic solution Place 2 drops into both eyes 4 (four) times daily. 5 mL 0   No current facility-administered medications for this visit.     Allergies as of 03/01/2016  . (No Known Allergies)    ROS:  General: Negative for anorexia, weight loss, fever,  chills, fatigue, weakness. ENT: Negative for hoarseness, difficulty swallowing , nasal congestion. CV: Negative for chest pain, angina, palpitations, dyspnea on exertion, peripheral edema.  Respiratory: Negative for dyspnea at rest, dyspnea on exertion, cough, sputum, wheezing.  GI: See history of present illness. GU:  Negative for dysuria, hematuria, urinary incontinence, urinary frequency, nocturnal urination.  Endo: Negative for unusual weight change.    Physical Examination:   There were no vitals taken for this visit.  General: Well-nourished, well-developed in no acute distress.  Eyes: No icterus. Conjunctivae pink. Mouth: Oropharyngeal mucosa moist and pink , no lesions erythema or exudate. Lungs: Clear to auscultation bilaterally. Non-labored. Heart: Regular rate and rhythm, no murmurs rubs or gallops.  Abdomen: Bowel sounds are normal, nontender, nondistended, no hepatosplenomegaly or masses, no abdominal bruits or hernia , no rebound or guarding.   Extremities: No lower extremity edema. No clubbing or deformities. Neuro: Alert and oriented x 3.  Grossly intact. Skin: Warm and dry, no jaundice.   Psych: Alert and cooperative, normal mood and affect.  Labs:    Imaging Studies: No results found.  Assessment and Plan:   Alvin [name] is a 66 y.o. 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 The patient was contacted multiple times for follow-up but never responded and [facility] office appointment. The patient also  has a history of colon polyps. The patient will be set up for an EGD and colonoscopy. The patient also reports that his abdominal pain is only present when he eats too close to his bedtime. He reports not having any pain if he does not eat before going to bed. He has been instructed to not eat before bedtime. I have discussed risks & benefits which include, but are not limited  to, bleeding, infection, perforation & drug reaction.  The patient agrees with this plan & written consent will be obtained.       Midge Miniumarren Noe Pittsley, MD. Clementeen GrahamFACG   Note: This dictation was prepared with Dragon dictation along with smaller phrase technology. Any transcriptional errors that result from this process are unintentional.

## 2016-03-02 ENCOUNTER — Encounter: Payer: Self-pay | Admitting: *Deleted

## 2016-03-03 NOTE — Discharge Instructions (Signed)

## 2016-03-06 ENCOUNTER — Encounter
Admission: RE | Disposition: A | Discharge status: Home / Self Care | Payer: Self-pay | Source: Ambulatory Visit | Attending: Gastroenterology

## 2016-03-06 ENCOUNTER — Ambulatory Visit: Payer: Medicare Other | Admitting: Anesthesiology

## 2016-03-06 ENCOUNTER — Ambulatory Visit
Admission: RE | Admit: 2016-03-06 | Discharge: 2016-03-06 | Disposition: A | Discharge status: Home / Self Care | Payer: Medicare Other | Source: Ambulatory Visit | Attending: Gastroenterology | Admitting: Gastroenterology

## 2016-03-06 DIAGNOSIS — Z85038 Personal history of other malignant neoplasm of large intestine: Secondary | ICD-10-CM | POA: Insufficient documentation

## 2016-03-06 DIAGNOSIS — I10 Essential (primary) hypertension: Secondary | ICD-10-CM | POA: Diagnosis not present

## 2016-03-06 DIAGNOSIS — K3189 Other diseases of stomach and duodenum: Secondary | ICD-10-CM | POA: Diagnosis not present

## 2016-03-06 DIAGNOSIS — K317 Polyp of stomach and duodenum: Secondary | ICD-10-CM

## 2016-03-06 DIAGNOSIS — Z1211 Encounter for screening for malignant neoplasm of colon: Secondary | ICD-10-CM | POA: Diagnosis not present

## 2016-03-06 DIAGNOSIS — Z79899 Other long term (current) drug therapy: Secondary | ICD-10-CM | POA: Insufficient documentation

## 2016-03-06 DIAGNOSIS — Z7984 Long term (current) use of oral hypoglycemic drugs: Secondary | ICD-10-CM | POA: Diagnosis not present

## 2016-03-06 DIAGNOSIS — K449 Diaphragmatic hernia without obstruction or gangrene: Secondary | ICD-10-CM | POA: Insufficient documentation

## 2016-03-06 DIAGNOSIS — Z860100 Personal history of colon polyps, unspecified: Secondary | ICD-10-CM

## 2016-03-06 DIAGNOSIS — K2950 Unspecified chronic gastritis without bleeding: Secondary | ICD-10-CM | POA: Insufficient documentation

## 2016-03-06 DIAGNOSIS — K640 First degree hemorrhoids: Secondary | ICD-10-CM | POA: Insufficient documentation

## 2016-03-06 DIAGNOSIS — Z09 Encounter for follow-up examination after completed treatment for conditions other than malignant neoplasm: Secondary | ICD-10-CM | POA: Diagnosis not present

## 2016-03-06 DIAGNOSIS — E119 Type 2 diabetes mellitus without complications: Secondary | ICD-10-CM | POA: Insufficient documentation

## 2016-03-06 HISTORY — PX: ESOPHAGOGASTRODUODENOSCOPY (EGD) WITH PROPOFOL: SHX5813

## 2016-03-06 HISTORY — PX: COLONOSCOPY WITH PROPOFOL: SHX5780

## 2016-03-06 LAB — GLUCOSE, CAPILLARY
GLUCOSE-CAPILLARY: 153 mg/dL — AB (ref 65–99)
Glucose-Capillary: 187 mg/dL — ABNORMAL HIGH (ref 65–99)

## 2016-03-06 SURGERY — COLONOSCOPY WITH PROPOFOL
Anesthesia: Monitor Anesthesia Care | Wound class: Contaminated

## 2016-03-06 MED ORDER — PROPOFOL 10 MG/ML IV BOLUS
INTRAVENOUS | Status: DC | PRN
  Administered 2016-03-06 (×4): 50 mg via INTRAVENOUS
  Administered 2016-03-06: 100 mg via INTRAVENOUS

## 2016-03-06 MED ORDER — LIDOCAINE HCL (CARDIAC) 20 MG/ML IV SOLN
INTRAVENOUS | Status: DC | PRN
  Administered 2016-03-06: 50 mg via INTRAVENOUS

## 2016-03-06 MED ORDER — OXYCODONE HCL 5 MG PO TABS: 5.0000 mg | ORAL_TABLET | Freq: Once | ORAL | Status: DC | PRN

## 2016-03-06 MED ORDER — OXYCODONE HCL 5 MG/5ML PO SOLN: 5.0000 mg | Freq: Once | ORAL | Status: DC | PRN

## 2016-03-06 MED ORDER — STERILE WATER FOR IRRIGATION IR SOLN
Status: DC | PRN
  Administered 2016-03-06: 11:00:00

## 2016-03-06 MED ORDER — GLYCOPYRROLATE 0.2 MG/ML IJ SOLN
INTRAMUSCULAR | Status: DC | PRN
  Administered 2016-03-06: 0.2 mg via INTRAVENOUS

## 2016-03-06 MED ORDER — LACTATED RINGERS IV SOLN
INTRAVENOUS | Status: DC
  Administered 2016-03-06: 10:00:00 via INTRAVENOUS

## 2016-03-06 SURGICAL SUPPLY — 35 items

## 2016-03-06 NOTE — Op Note (Signed)
[facility] Gastroenterology Patient Name: Alvin [name] Procedure Date: 03/06/2016 10:33 AM MRN: [id number] Account #: [id number] Date of Birth: May 28, 1950 Admit Type: Outpatient Age: 66 Room: [facility] OR ROOM 01 Gender: Male Note Status: Finalized Procedure:            Colonoscopy Indications:          High risk colon cancer surveillance: Personal history                        of colonic polyps Providers:            Midge Minium MD, MD Medicines:            Propofol per Anesthesia Complications:        No immediate complications. Procedure:            Pre-Anesthesia Assessment:                       - Prior to the procedure, a History and Physical was                        performed, and patient medications and allergies were                        reviewed. The patient's tolerance of previous                        anesthesia was also reviewed. The risks and benefits of                        the procedure and the sedation options and risks were                        discussed with the patient. All questions were                        answered, and informed consent was obtained. Prior                        Anticoagulants: The patient has taken no previous                        anticoagulant or antiplatelet agents. ASA Grade                        Assessment: II - A patient with mild systemic disease.                        After reviewing the risks and benefits, the patient was                        deemed in satisfactory condition to undergo the                        procedure.                       After obtaining informed consent, the colonoscope was                        passed under direct vision. Throughout the  procedure,                        the patient's blood pressure, pulse, and oxygen                        saturations were monitored continuously. The Olympus                        190 Colonoscope [id number]) was introduced through the                     anus and advanced to the the cecum, identified by                        appendiceal orifice and ileocecal valve. The                        colonoscopy was performed without difficulty. The                        patient tolerated the procedure well. The quality of                        the bowel preparation was good. Findings:      The perianal and digital rectal examinations were normal.      Non-bleeding internal hemorrhoids were found during retroflexion. The       hemorrhoids were Grade I (internal hemorrhoids that do not prolapse). Impression:           - Non-bleeding internal hemorrhoids.                       - No specimens collected. Recommendation:       - Discharge patient to home.                       - Resume previous diet.                       - Continue present medications.                       - Repeat colonoscopy in 5 years for surveillance. Procedure Code(s):    --- Professional ---                       [phone], Colonoscopy, flexible; diagnostic, including                        collection of specimen(s) by brushing or washing, when                        performed (separate procedure) Diagnosis Code(s):    --- Professional ---                       Z86.010, Personal history of colonic polyps CPT copyright 2016 American Medical Association. All rights reserved. The codes documented in this report are preliminary and upon coder review may  be revised to meet current compliance requirements. Midge Miniumarren Kema Santaella MD, MD 03/06/2016 11:01:20 AM This report has been signed electronically. Number of Addenda: 0 Note Initiated On: 03/06/2016 10:33 AM Scope Withdrawal Time: 0 hours  7 minutes 57 seconds  Total Procedure Duration: 0 hours 10 minutes 49 seconds       [facility]

## 2016-03-06 NOTE — Op Note (Signed)
[facility] Gastroenterology Patient Name: Alvin [name] Procedure Date: 03/06/2016 10:33 AM MRN: [id number] Account #: [id number] Date of Birth: 1950-03-31 Admit Type: Outpatient Age: 66 Room: [facility] OR ROOM 01 Gender: Male Note Status: Finalized Procedure:            Upper GI endoscopy Indications:          Follow-up of intestinal metaplasia Providers:            Midge Minium MD, MD Medicines:            Propofol per Anesthesia Complications:        No immediate complications. Procedure:            Pre-Anesthesia Assessment:                       - Prior to the procedure, a History and Physical was                        performed, and patient medications and allergies were                        reviewed. The patient's tolerance of previous                        anesthesia was also reviewed. The risks and benefits of                        the procedure and the sedation options and risks were                        discussed with the patient. All questions were                        answered, and informed consent was obtained. Prior                        Anticoagulants: The patient has taken no previous                        anticoagulant or antiplatelet agents. ASA Grade                        Assessment: II - A patient with mild systemic disease.                        After reviewing the risks and benefits, the patient was                        deemed in satisfactory condition to undergo the                        procedure.                       After obtaining informed consent, the endoscope was                        passed under direct vision. Throughout the procedure,                        the patient's blood  pressure, pulse, and oxygen                        saturations were monitored continuously. The Olympus                        GIF H180J Endoscope (Z#:[id number]#:[id number]) was introduced through                        the mouth, and advanced to the  second part of duodenum.                        The upper GI endoscopy was accomplished without                        difficulty. The patient tolerated the procedure well. Findings:      A small hiatal hernia was present.      A few 3 to 9 mm sessile polyps with no stigmata of recent bleeding were       found in the cardia and in the gastric body. Biopsies were taken with a       cold forceps for histology.      Diffuse moderate inflammation characterized by erythema was found in the       gastric antrum. Biopsies were taken with a cold forceps for histology.      The examined duodenum was normal. Impression:           - Small hiatal hernia.                       - A few gastric polyps. Biopsied.                       - Gastritis. Biopsied.                       - Normal examined duodenum. Recommendation:       - Discharge patient to home.                       - Resume previous diet.                       - Continue present medications.                       - Await pathology results.                       - Perform a colonoscopy today. Procedure Code(s):    --- Professional ---                       [phone], Esophagogastroduodenoscopy, flexible, transoral;                        with biopsy, single or multiple Diagnosis Code(s):    --- Professional ---                       K29.70, Gastritis, unspecified, without bleeding                       K31.7, Polyp of stomach and duodenum  K31.89, Other diseases of stomach and duodenum CPT copyright 2016 American Medical Association. All rights reserved. The codes documented in this report are preliminary and upon coder review may  be revised to meet current compliance requirements. Midge Minium MD, MD 03/06/2016 10:47:52 AM This report has been signed electronically. Number of Addenda: 0 Note Initiated On: 03/06/2016 10:33 AM Total Procedure Duration: 0 hours 3 minutes 54 seconds       [facility]

## 2016-03-06 NOTE — Anesthesia Postprocedure Evaluation (Signed)
Anesthesia Post Note  Patient: Alvin [name]  Procedure(s) Performed: Procedure(s) (LRB): COLONOSCOPY WITH PROPOFOL (N/A) ESOPHAGOGASTRODUODENOSCOPY (EGD) WITH PROPOFOL (N/A)  Patient location during evaluation: PACU Anesthesia Type: MAC Level of consciousness: awake Pain management: pain level controlled Vital Signs Assessment: post-procedure vital signs reviewed and stable Respiratory status: spontaneous breathing Cardiovascular status: blood pressure returned to baseline Postop Assessment: no headache Anesthetic complications: no    Jaci Standard, III,  Junette Bernat D

## 2016-03-06 NOTE — Transfer of Care (Signed)
Immediate Anesthesia Transfer of Care Note  Patient: Alvin [name]  Procedure(s) Performed: Procedure(s): COLONOSCOPY WITH PROPOFOL (N/A) ESOPHAGOGASTRODUODENOSCOPY (EGD) WITH PROPOFOL (N/A)  Patient Location: PACU  Anesthesia Type: MAC  Level of Consciousness: awake, alert  and patient cooperative  Airway and Oxygen Therapy: Patient Spontanous Breathing and Patient connected to supplemental oxygen  Post-op Assessment: Post-op Vital signs reviewed, Patient's Cardiovascular Status Stable, Respiratory Function Stable, Patent Airway and No signs of Nausea or vomiting  Post-op Vital Signs: Reviewed and stable  Complications: No apparent anesthesia complications

## 2016-03-06 NOTE — Anesthesia Procedure Notes (Signed)
Procedure Name: MAC Date/Time: 03/06/2016 10:33 AM Performed by: Janna Arch Pre-anesthesia Checklist: Patient identified, Emergency Drugs available, Suction available and Patient being monitored Patient Re-evaluated:Patient Re-evaluated prior to inductionOxygen Delivery Method: Nasal cannula

## 2016-03-06 NOTE — Anesthesia Preprocedure Evaluation (Signed)
Anesthesia Evaluation  Patient identified by MRN, date of birth, ID band Patient awake    Reviewed: Allergy & Precautions, H&P , NPO status , Patient's Chart, lab work & pertinent test results  Airway Mallampati: II  TM Distance: >3 FB Neck ROM: full    Dental no notable dental hx.    Pulmonary neg pulmonary ROS,    Pulmonary exam normal        Cardiovascular hypertension, On Medications Normal cardiovascular exam     Neuro/Psych    GI/Hepatic PUD,   Endo/Other  diabetes, Well Controlled, Type 2  Renal/GU      Musculoskeletal   Abdominal   Peds  Hematology negative hematology ROS (+)   Anesthesia Other Findings   Reproductive/Obstetrics negative OB ROS                             Anesthesia Physical Anesthesia Plan  ASA: II  Anesthesia Plan: MAC   Post-op Pain Management:    Induction:   Airway Management Planned:   Additional Equipment:   Intra-op Plan:   Post-operative Plan:   Informed Consent: I have reviewed the patients History and Physical, chart, labs and discussed the procedure including the risks, benefits and alternatives for the proposed anesthesia with the patient or authorized representative who has indicated his/her understanding and acceptance.     Plan Discussed with:   Anesthesia Plan Comments:         Anesthesia Quick Evaluation

## 2016-03-06 NOTE — H&P (Signed)
Midge Miniumarren Shirel Mallis, MD [facility] [address] [place], [place] [address] Phone: [phone] Fax : [phone]  Primary Care Physician:  [facility] Primary Gastroenterologist:  Dr. Servando SnareWohl  Pre-Procedure History & Physical: HPI:  Alvin [name] is a 66 y.o. male is here for an endoscopy and colonoscopy.   Past Medical History:  Diagnosis Date  . Anxiety   . Diabetes mellitus without complication (HCC)   . Hypertension     Past Surgical History:  Procedure Laterality Date  . COLON SURGERY  approx 2007   cyst removed   . ESOPHAGOGASTRODUODENOSCOPY (EGD) WITH PROPOFOL N/A 08/19/2015   Procedure: ESOPHAGOGASTRODUODENOSCOPY (EGD) WITH PROPOFOL;  Surgeon: Midge Miniumarren Cristopher Ciccarelli, MD;  [facility];  Service: Endoscopy;  Laterality: N/A;  . PANCREATIC CYST EXCISION      Prior to Admission medications   Medication Sig Start Date End Date Taking? Authorizing Provider  glipiZIDE (GLUCOTROL) 5 MG tablet Take 5 mg by mouth 2 (two) times daily before a meal.    Yes Historical Provider, MD  pantoprazole (PROTONIX) 40 MG tablet Take 1 tablet (40 mg total) by mouth daily before breakfast. 08/19/15  Yes Midge Miniumarren Joellyn Grandt, MD  quinapril (ACCUPRIL) 20 MG tablet Take 20 mg by mouth daily.   Yes Historical Provider, MD  sulfacetamide (BLEPH-10) 10 % ophthalmic solution Place 2 drops into both eyes 4 (four) times daily. 02/26/15  Yes Charmayne Sheerharles M Beers, PA-C  allopurinol (ZYLOPRIM) 300 MG tablet Take 300 mg by mouth.    Historical Provider, MD  cetirizine (ZYRTEC) 10 MG tablet Take 10 mg by mouth.    Historical Provider, MD  citalopram (CELEXA) 20 MG tablet Take 1 tablet (20 mg total) by mouth daily. Patient not taking: Reported on 03/02/2016 07/12/15   Audery AmelJohn T Clapacs, MD  fluticasone (FLONASE) 50 MCG/ACT nasal spray 1 spray by Each Nare route daily.    Historical Provider, MD    Allergies as of 03/01/2016  . (No Known Allergies)    Family History  Problem Relation Age of Onset  . Alcohol  abuse Neg Hx   . Arthritis Neg Hx   . Asthma Neg Hx   . Birth defects Neg Hx   . Cancer Neg Hx   . COPD Neg Hx   . Depression Neg Hx   . Diabetes Neg Hx   . Drug abuse Neg Hx   . Early death Neg Hx   . Hearing loss Neg Hx   . Heart disease Neg Hx   . Hyperlipidemia Neg Hx   . Hypertension Neg Hx   . Kidney disease Neg Hx   . Learning disabilities Neg Hx   . Mental illness Neg Hx   . Mental retardation Neg Hx   . Stroke Neg Hx   . Miscarriages / Stillbirths Neg Hx   . Vision loss Neg Hx   . Varicose Veins Neg Hx     Social History   Social History  . Marital status: Married    Spouse name: N/A  . Number of children: N/A  . Years of education: N/A   Occupational History  . Not on file.   Social History Main Topics  . Smoking status: Never Smoker  . Smokeless tobacco: Never Used  . Alcohol use 0.0 oz/week     Comment: occas  . Drug use: No  . Sexual activity: Not on file   Other Topics Concern  . Not on file   Social History Narrative  . No narrative on file  Review of Systems: See HPI, otherwise negative ROS  Physical Exam: BP 130/82   Pulse 71   Temp (!) 96.9 F (36.1 C) (Tympanic)   Resp 16   Ht 5\' 3"  (1.6 m)   Wt 222 lb (100.7 kg)   SpO2 96%   BMI 39.33 kg/m  General:   Alert,  pleasant and cooperative in NAD Head:  Normocephalic and atraumatic. Neck:  Supple; no masses or thyromegaly. Lungs:  Clear throughout to auscultation.    Heart:  Regular rate and rhythm. Abdomen:  Soft, nontender and nondistended. Normal bowel sounds, without guarding, and without rebound.   Neurologic:  Alert and  oriented x4;  grossly normal neurologically.  Impression/Plan: Alvin [name] is here for an endoscopy and colonoscopy to be performed for gastric intestinal metaplasia and history of polyps  Risks, benefits, limitations, and alternatives regarding  endoscopy and colonoscopy have been reviewed with the patient.  Questions have been answered.  All  parties agreeable.   Midge Minium, MD  03/06/2016, 9:45 AM

## 2016-03-07 ENCOUNTER — Encounter: Payer: Self-pay | Admitting: Gastroenterology

## 2016-03-08 ENCOUNTER — Encounter: Payer: Self-pay | Admitting: Gastroenterology

## 2016-03-10 ENCOUNTER — Telehealth: Payer: Self-pay

## 2016-03-10 ENCOUNTER — Other Ambulatory Visit: Payer: Self-pay

## 2016-03-10 DIAGNOSIS — K259 Gastric ulcer, unspecified as acute or chronic, without hemorrhage or perforation: Secondary | ICD-10-CM

## 2016-03-10 DIAGNOSIS — K31A0 Gastric intestinal metaplasia, unspecified: Secondary | ICD-10-CM

## 2016-03-10 DIAGNOSIS — K3189 Other diseases of stomach and duodenum: Secondary | ICD-10-CM

## 2016-03-10 DIAGNOSIS — D509 Iron deficiency anemia, unspecified: Secondary | ICD-10-CM

## 2016-03-10 DIAGNOSIS — D519 Vitamin B12 deficiency anemia, unspecified: Secondary | ICD-10-CM

## 2016-03-10 NOTE — Telephone Encounter (Signed)
Pt's son notified of procedure results and labs needed.

## 2016-03-10 NOTE — Telephone Encounter (Signed)
-----   Message from Midge Miniumarren Wohl, MD sent at 03/10/2016 10:45 AM EST ----- Let the patient know that there is no cancer or precancerous cells on the biopsies but he should have blood work sent off for parietal cell antibodies and B12 levels.

## 2016-03-16 ENCOUNTER — Other Ambulatory Visit
Admission: RE | Admit: 2016-03-16 | Discharge: 2016-03-16 | Disposition: A | Discharge status: Home / Self Care | Payer: Medicare Other | Source: Ambulatory Visit | Attending: Gastroenterology | Admitting: Gastroenterology

## 2016-03-16 DIAGNOSIS — K3189 Other diseases of stomach and duodenum: Secondary | ICD-10-CM | POA: Diagnosis present

## 2016-03-16 LAB — VITAMIN B12: VITAMIN B 12: 96 pg/mL — AB (ref 180–914)

## 2016-03-17 LAB — ANTI-PARIETAL ANTIBODY: Parietal Cell Antibody-IgG: 91.8 Units — ABNORMAL HIGH (ref 0.0–20.0)

## 2016-03-23 ENCOUNTER — Other Ambulatory Visit: Payer: Self-pay

## 2016-03-23 NOTE — Telephone Encounter (Signed)
-----   Message from Midge Miniumarren Wohl, MD sent at 03/19/2016  5:10 PM EST ----- This patient was found to have pernicious anemia and will need B12 shots.  He also needs repeat EGDs every 1 year and then every 3 years.

## 2016-03-23 NOTE — Telephone Encounter (Signed)
Pt's son notified of lab results and to contact PCP to start B12 injections. Pt has been added to recall list for EGD repeat in 03/2017.

## 2016-03-27 ENCOUNTER — Telehealth: Payer: Self-pay

## 2016-03-27 NOTE — Telephone Encounter (Signed)
Called patient back and could not leave him a voicemail since it's not set-up. I then called his daughter and was able to leave the message with her. I gave her the appointment information (04/03/2016 at 3:15 PM). She stated that she would call her father to let him know and she also stated that she would take him to that appointment.

## 2016-03-27 NOTE — Telephone Encounter (Signed)
Patient called stating that he continues to have abdominal pain and nausea. Patient denies having vomiting, fever and chills. Patient would like to schedule an appointment to be seen by Dr. Servando SnareWohl as soon as possible. He stated he was given pantoprazole but stopped taking them a month ago because he felt that it was not helping him at all. Patient needs an interpreter.

## 2016-03-27 NOTE — Telephone Encounter (Signed)
Thank you :)

## 2016-04-03 ENCOUNTER — Ambulatory Visit: Payer: Medicare Other | Admitting: Gastroenterology

## 2016-08-18 ENCOUNTER — Encounter: Payer: Self-pay | Admitting: Gastroenterology

## 2016-10-11 ENCOUNTER — Ambulatory Visit: Payer: Medicare Other | Admitting: Gastroenterology

## 2016-12-23 IMAGING — US US ABDOMEN COMPLETE
1 series · 14 of 25 positions shown · non-contrast
Comparison: 06/03/2014

CLINICAL DATA: Right lower quadrant abdominal pain. Check
gallbladder.

EXAM:
ULTRASOUND ABDOMEN COMPLETE

[Series 1: us abdomen complete · 0.23mm/px · 14 of 92 slices shown]
[im 1/92]
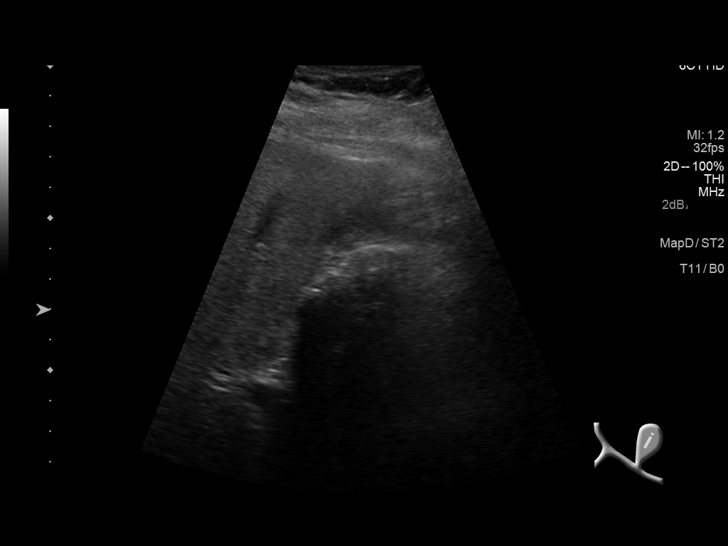
[im 8/92]
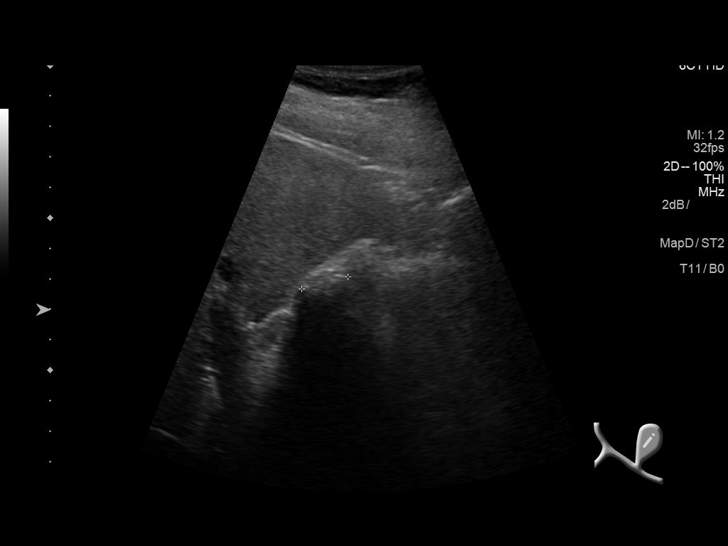
[im 16/92]
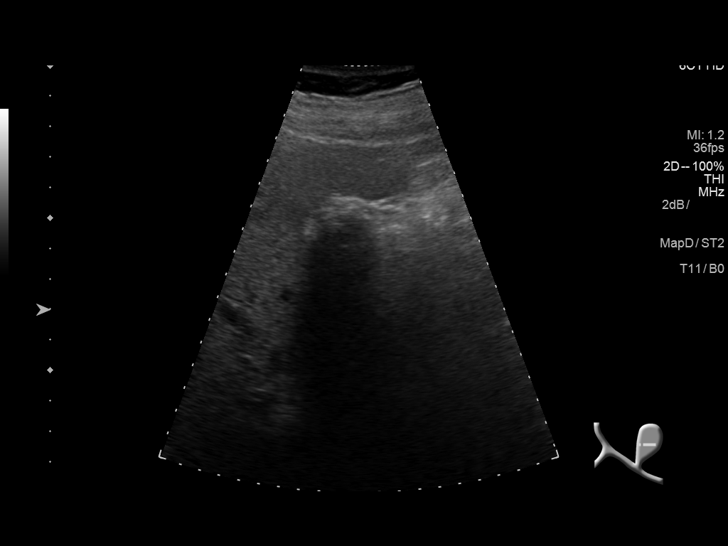
[im 23/92]
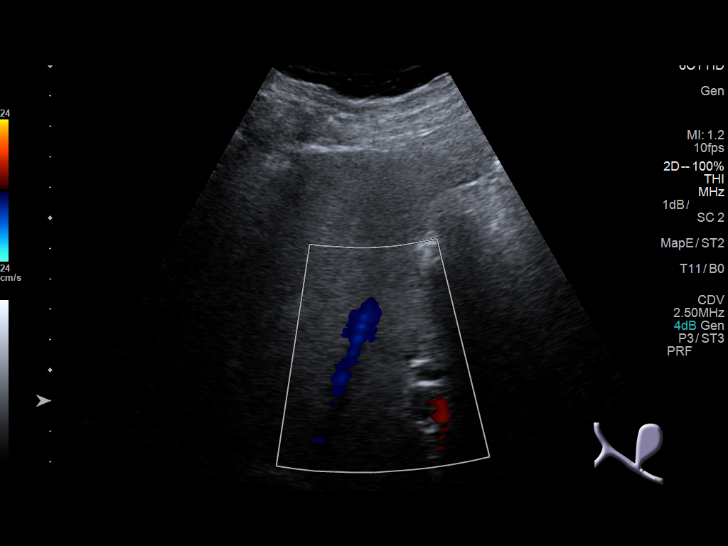
[im 31/92]
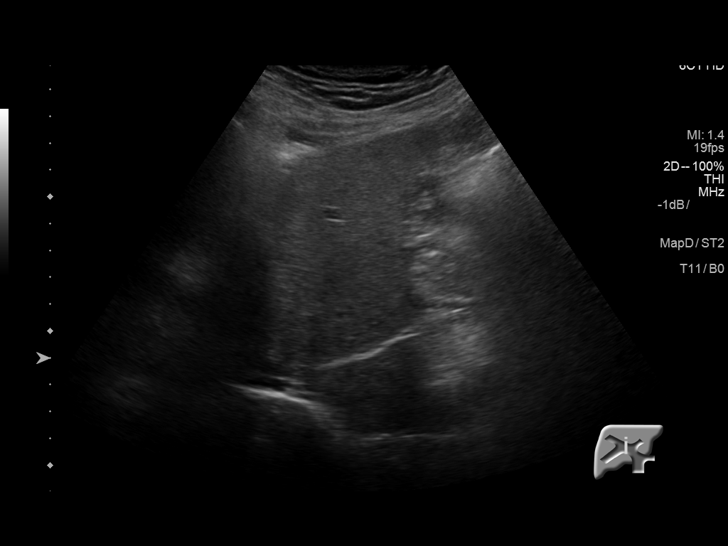
[im 35/92]
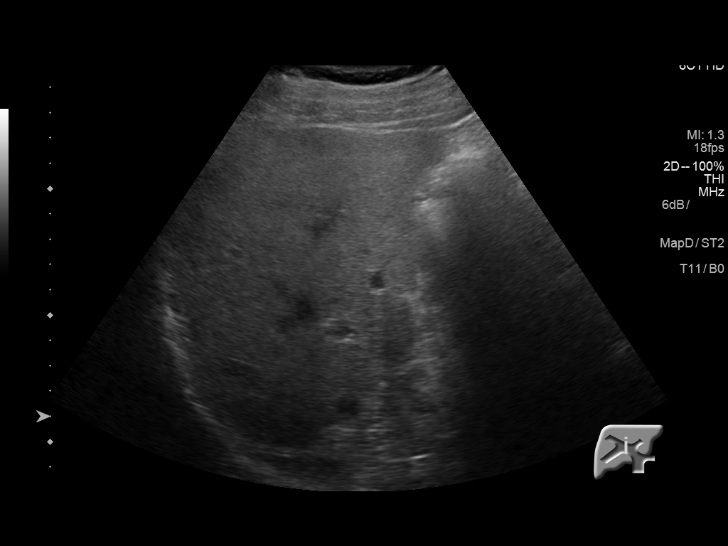
[im 42/92]
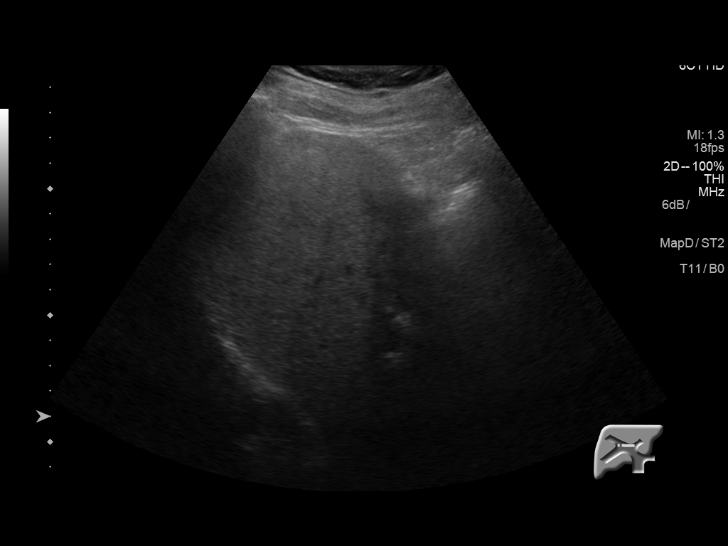
[im 50/92]
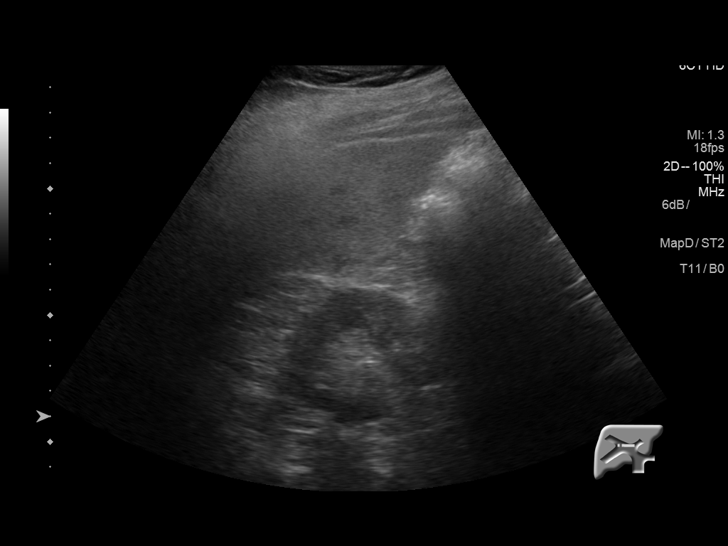
[im 57/92]
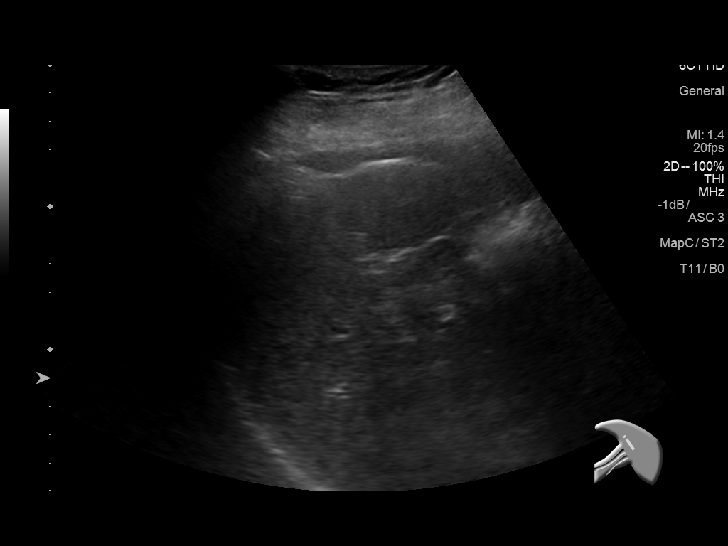
[im 61/92]
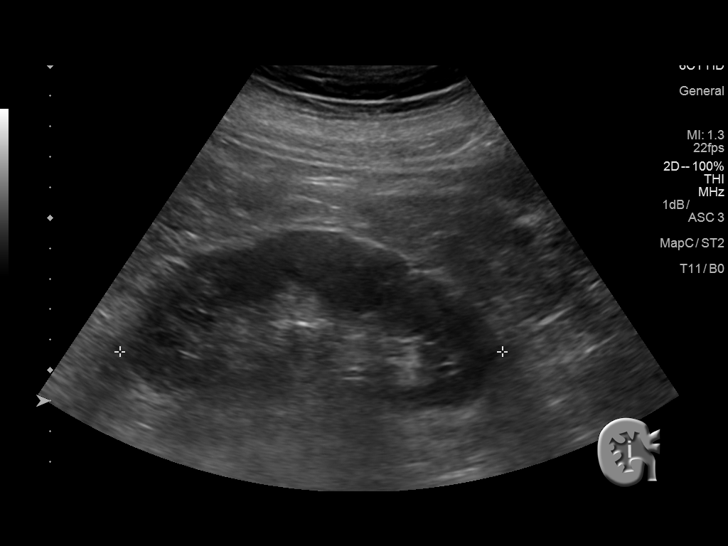
[im 69/92]
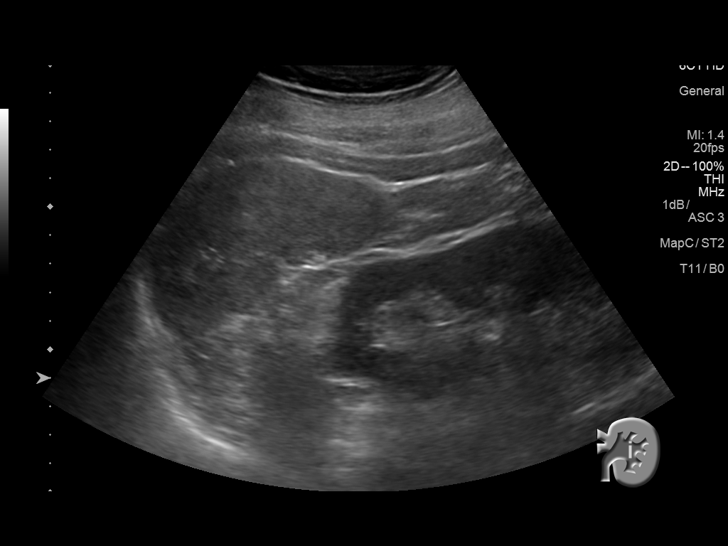
[im 76/92]
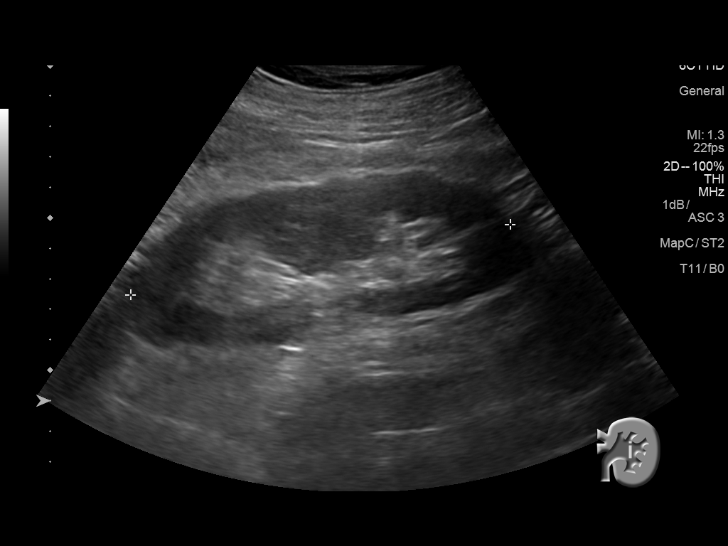
[im 84/92]
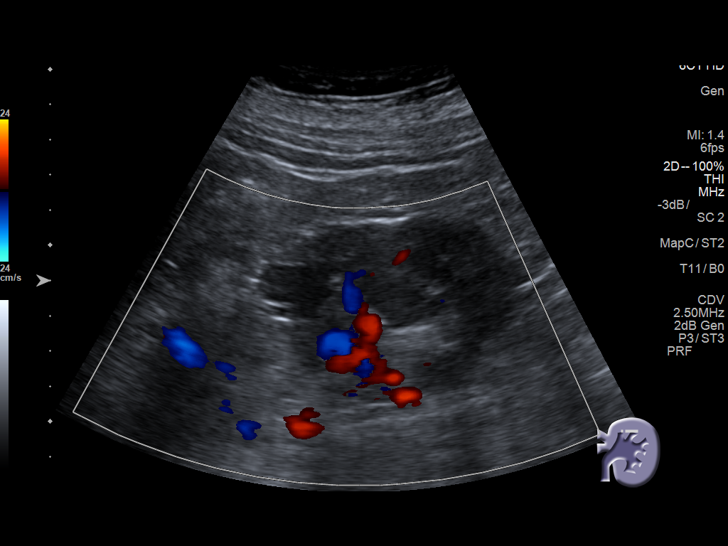
[im 92/92]
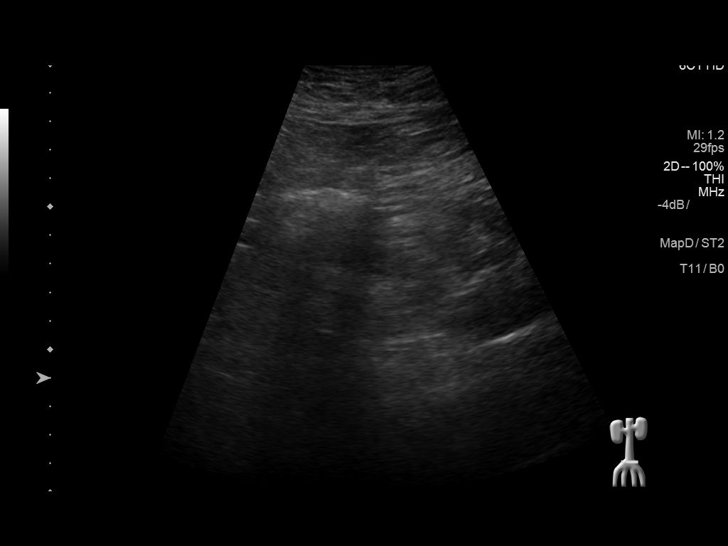

[14 of 25 positions shown; findings below may reference images not displayed]

FINDINGS: Gallbladder: The gallbladder is filled with gallstones, limiting
posterior visualization of the wall. No indication of inflammatory
wall thickening and no focal tenderness.

Common bile duct: Diameter: 8 mm, enlarged for age

Liver: Echogenic and mildly coarsened texture diffusely. No focal
mass is seen. Antegrade flow in the imaged hepatic and portal venous
system

IVC: Negative by grayscale

Pancreas: Visualized portion unremarkable.

Spleen: Size and appearance within normal limits.

Right Kidney: Length: 12.6 cm. Echogenicity within normal limits. No
mass or hydronephrosis visualized.

Left Kidney: Length: 12.7 cm. Echogenicity within normal limits. No
solid mass or hydronephrosis visualized. 18 mm cyst incidentally
noted.

Abdominal aorta: Partial visualization without evidence of aneurysm.
IMPRESSION: 1. Cholelithiasis.
2. Mildly dilated common bile duct, correlate with biliary labs.
3. Hepatic steatosis.

## 2017-03-19 ENCOUNTER — Telehealth: Payer: Self-pay

## 2017-03-19 NOTE — Telephone Encounter (Signed)
Mailed out recall letter requesting a call from pt to schedule 1 year EGD.

## 2017-03-19 NOTE — Telephone Encounter (Signed)
-----   Message from Alvin HemanGinger Rosetta [name], CMA sent at 03/23/2016  2:28 PM EST ----- Pt needs his 1 year EGD repeat. Hx of stomach cancer. Pt will need to repeat every 3 years after this.

## 2017-07-02 ENCOUNTER — Emergency Department
Admission: EM | Admit: 2017-07-02 | Discharge: 2017-07-02 | Disposition: A | Discharge status: Home / Self Care | Payer: Medicare Other | Source: Home / Self Care | Attending: Emergency Medicine | Admitting: Emergency Medicine

## 2017-07-02 ENCOUNTER — Encounter: Payer: Self-pay | Admitting: Emergency Medicine

## 2017-07-02 ENCOUNTER — Other Ambulatory Visit: Payer: Self-pay

## 2017-07-02 DIAGNOSIS — E119 Type 2 diabetes mellitus without complications: Secondary | ICD-10-CM | POA: Insufficient documentation

## 2017-07-02 DIAGNOSIS — Z79899 Other long term (current) drug therapy: Secondary | ICD-10-CM | POA: Insufficient documentation

## 2017-07-02 DIAGNOSIS — I10 Essential (primary) hypertension: Secondary | ICD-10-CM | POA: Insufficient documentation

## 2017-07-02 DIAGNOSIS — A419 Sepsis, unspecified organism: Secondary | ICD-10-CM | POA: Diagnosis not present

## 2017-07-02 DIAGNOSIS — J029 Acute pharyngitis, unspecified: Secondary | ICD-10-CM

## 2017-07-02 DIAGNOSIS — R739 Hyperglycemia, unspecified: Secondary | ICD-10-CM | POA: Diagnosis not present

## 2017-07-02 LAB — GROUP A STREP BY PCR: Group A Strep by PCR: NOT DETECTED

## 2017-07-02 NOTE — Discharge Instructions (Signed)
Follow-up with your primary care provider at [facility] clinic if not improving.

## 2017-07-02 NOTE — ED Provider Notes (Signed)
[facility] Emergency Department Provider Note  ____________________________________________   First MD Initiated Contact with Patient 07/02/17 [phone]     (approximate)  I have reviewed the triage vital signs and the nursing notes.   HISTORY Per Spanish interpreter  Chief Complaint Sore Throat; Generalized Body Aches; and Headache  HPI Alvin [name] is a 67 y.o. male is here with complaint of throat pain, body aches and headache for the last 3 days.  Patient states that his sore throat is worse today.  He denies any difficulty breathing or eating.  No other family members are sick with similar symptoms.  Patient denies any nausea, vomiting, diarrhea, fever or chills.  He rates his pain as an 8 out of 10.   Past Medical History:  Diagnosis Date  . Anxiety   . Diabetes mellitus without complication (HCC)   . Hypertension     Patient Active Problem List   Diagnosis Date Noted  . Hx of colonic polyps   . Gastric polyp   . Heartburn   . Peptic ulcer of stomach   . Gastritis   . Abdominal pain, epigastric   . Panic attack 07/12/2015  . Right lower quadrant abdominal pain 12/01/2014  . Gallstones 12/01/2014  . Encounter for screening for malignant neoplasm of colon 01/24/2013  . Abdominal bloating 01/24/2013    Past Surgical History:  Procedure Laterality Date  . COLON SURGERY  approx 2007   cyst removed   . COLONOSCOPY WITH PROPOFOL N/A 03/06/2016   Procedure: COLONOSCOPY WITH PROPOFOL;  Surgeon: Midge Minium, MD;  Location: [facility];  Service: Endoscopy;  Laterality: N/A;  . ESOPHAGOGASTRODUODENOSCOPY (EGD) WITH PROPOFOL N/A 08/19/2015   Procedure: ESOPHAGOGASTRODUODENOSCOPY (EGD) WITH PROPOFOL;  Surgeon: Midge Minium, MD;  Location: [facility];  Service: Endoscopy;  Laterality: N/A;  . ESOPHAGOGASTRODUODENOSCOPY (EGD) WITH PROPOFOL N/A 03/06/2016   Midge Minium, MD - PT NEEDS REPEAT IN 03/2017  . PANCREATIC CYST EXCISION       Prior to Admission medications   Medication Sig Start Date End Date Taking? Authorizing Provider  allopurinol (ZYLOPRIM) 300 MG tablet Take 300 mg by mouth.    [provider]  cetirizine (ZYRTEC) 10 MG tablet Take 10 mg by mouth.    [provider]  fluticasone (FLONASE) 50 MCG/ACT nasal spray 1 spray by Each Nare route daily.    [provider]  glipiZIDE (GLUCOTROL) 5 MG tablet Take 5 mg by mouth 2 (two) times daily before a meal.     [provider]  pantoprazole (PROTONIX) 40 MG tablet Take 1 tablet (40 mg total) by mouth daily before breakfast. 08/19/15   Midge Minium, MD  quinapril (ACCUPRIL) 20 MG tablet Take 20 mg by mouth daily.    [provider]    Allergies Patient has no known allergies.  Family History  Problem Relation Age of Onset  . Alcohol abuse Neg Hx   . Arthritis Neg Hx   . Asthma Neg Hx   . Birth defects Neg Hx   . Cancer Neg Hx   . COPD Neg Hx   . Depression Neg Hx   . Diabetes Neg Hx   . Drug abuse Neg Hx   . Early death Neg Hx   . Hearing loss Neg Hx   . Heart disease Neg Hx   . Hyperlipidemia Neg Hx   . Hypertension Neg Hx   . Kidney disease Neg Hx   . Learning disabilities Neg Hx   .  Mental illness Neg Hx   . Mental retardation Neg Hx   . Stroke Neg Hx   . Miscarriages / Stillbirths Neg Hx   . Vision loss Neg Hx   . Varicose Veins Neg Hx     Social History Social History   Tobacco Use  . Smoking status: Never Smoker  . Smokeless tobacco: Never Used  Substance Use Topics  . Alcohol use: Yes    Alcohol/week: 0.0 oz    Comment: occas  . Drug use: No    Review of Systems Constitutional: No fever/chills Eyes: No visual changes. ENT: Positive sore throat.  Negative for ear pain. Cardiovascular: Denies chest pain. Respiratory: Denies shortness of breath.  Negative for coughing. Gastrointestinal: No abdominal pain.  No nausea, no vomiting.   Musculoskeletal: Negative for back pain. Skin:  Negative for rash. Neurological: Positive for headache, no focal weakness or numbness. ___________________________________________   PHYSICAL EXAM:  VITAL SIGNS: ED Triage Vitals  Enc Vitals Group     BP 07/02/17 0813 139/68     Pulse Rate 07/02/17 0813 81     Resp 07/02/17 0813 16     Temp 07/02/17 0813 99.1 F (37.3 C)     Temp Source 07/02/17 0813 Oral     SpO2 07/02/17 0813 95 %     Weight 07/02/17 0809 210 lb (95.3 kg)     Height 07/02/17 0809  (1.651 m)     Head Circumference --      Peak Flow --      Pain Score 07/02/17 0814 8     Pain Loc --      Pain Edu? --      Excl. in GC? --    Constitutional: Alert and oriented. Well appearing and in no acute distress. Eyes: Conjunctivae are normal.  Head: Atraumatic. Nose: No congestion/rhinnorhea. Mouth/Throat: Mucous membranes are moist.  Oropharynx non-erythematous.  No exudate.  Uvula is midline. Neck: No stridor.   Hematological/Lymphatic/Immunilogical: No cervical lymphadenopathy. Cardiovascular: Normal rate, regular rhythm. Grossly normal heart sounds.  Good peripheral circulation. Respiratory: Normal respiratory effort.  No retractions. Lungs CTAB. Musculoskeletal: Moves upper and lower extremities without any difficulty. Neurologic:  Normal speech and language. No gross focal neurologic deficits are appreciated. No gait instability. Skin:  Skin is warm, dry and intact. No rash noted. Psychiatric: Mood and affect are normal. Speech and behavior are normal.  ____________________________________________   LABS (all labs ordered are listed, but only abnormal results are displayed)  Labs Reviewed  GROUP A STREP BY PCR     PROCEDURES  Procedure(s) performed: None  Procedures  Critical Care performed: No  ____________________________________________   INITIAL IMPRESSION / ASSESSMENT AND PLAN / ED COURSE  As part of my medical decision making, I reviewed the following data within the electronic  MEDICAL RECORD NUMBER Notes from prior ED visits and [place] Controlled Substance Database  Patient was made aware that strep test was negative.  Most likely this is a viral pharyngitis.  Interpreter explained this to the patient and he will try salt water gargles at this time.  He is also encouraged to use Tylenol or ibuprofen if needed for his throat pain.  He is to follow-up with his PCP at [facility] if any continued problems in 3 to 4 days. ____________________________________________   FINAL CLINICAL IMPRESSION(S) / ED DIAGNOSES  Final diagnoses:  Viral pharyngitis     ED Discharge Orders    None       Note:  This document was prepared using Dragon voice recognition software and may include unintentional dictation errors.    Tommi Rumps, PA-C 07/02/17 1201    Sharyn Creamer, MD 07/02/17 1626

## 2017-07-02 NOTE — ED Notes (Signed)
See triage note  Presents with a 3 day hx of sore throat and body aches  Having increased pain to throat today  And low grade fever

## 2017-07-02 NOTE — ED Triage Notes (Signed)
Patient speaks Spanish, Interpreter present for interview.  Patient complaining of sore throat, body aches, and headache X 3 days.  Started with sore throat, here today because throat pain is worse.  Indicates throat pain is in the middle.  Patient denies problem eating.  Speech is clear.

## 2017-07-03 ENCOUNTER — Inpatient Hospital Stay
Admission: EM | Admit: 2017-07-03 | Discharge: 2017-07-10 | DRG: 871 | Disposition: A | Discharge status: Home / Self Care | Payer: Medicare Other | Attending: Internal Medicine | Admitting: Internal Medicine

## 2017-07-03 ENCOUNTER — Emergency Department: Payer: Medicare Other

## 2017-07-03 ENCOUNTER — Other Ambulatory Visit: Payer: Self-pay

## 2017-07-03 ENCOUNTER — Encounter: Payer: Self-pay | Admitting: Emergency Medicine

## 2017-07-03 DIAGNOSIS — E669 Obesity, unspecified: Secondary | ICD-10-CM | POA: Diagnosis present

## 2017-07-03 DIAGNOSIS — E1165 Type 2 diabetes mellitus with hyperglycemia: Secondary | ICD-10-CM | POA: Diagnosis present

## 2017-07-03 DIAGNOSIS — R739 Hyperglycemia, unspecified: Secondary | ICD-10-CM

## 2017-07-03 DIAGNOSIS — Z23 Encounter for immunization: Secondary | ICD-10-CM

## 2017-07-03 DIAGNOSIS — I10 Essential (primary) hypertension: Secondary | ICD-10-CM | POA: Diagnosis present

## 2017-07-03 DIAGNOSIS — Z6834 Body mass index (BMI) 34.0-34.9, adult: Secondary | ICD-10-CM | POA: Diagnosis not present

## 2017-07-03 DIAGNOSIS — Z7984 Long term (current) use of oral hypoglycemic drugs: Secondary | ICD-10-CM

## 2017-07-03 DIAGNOSIS — J918 Pleural effusion in other conditions classified elsewhere: Secondary | ICD-10-CM | POA: Diagnosis present

## 2017-07-03 DIAGNOSIS — A419 Sepsis, unspecified organism: Secondary | ICD-10-CM | POA: Diagnosis present

## 2017-07-03 DIAGNOSIS — J189 Pneumonia, unspecified organism: Secondary | ICD-10-CM

## 2017-07-03 DIAGNOSIS — R0902 Hypoxemia: Secondary | ICD-10-CM | POA: Diagnosis present

## 2017-07-03 DIAGNOSIS — J181 Lobar pneumonia, unspecified organism: Secondary | ICD-10-CM | POA: Diagnosis present

## 2017-07-03 DIAGNOSIS — R0602 Shortness of breath: Secondary | ICD-10-CM

## 2017-07-03 LAB — URINALYSIS, COMPLETE (UACMP) WITH MICROSCOPIC
BILIRUBIN URINE: NEGATIVE
Bacteria, UA: NONE SEEN
Hgb urine dipstick: NEGATIVE
KETONES UR: 5 mg/dL — AB
LEUKOCYTES UA: NEGATIVE
NITRITE: NEGATIVE
PROTEIN: NEGATIVE mg/dL
Specific Gravity, Urine: 1.031 — ABNORMAL HIGH (ref 1.005–1.030)
pH: 5 (ref 5.0–8.0)

## 2017-07-03 LAB — CBC WITH DIFFERENTIAL/PLATELET
BASOS ABS: 0 10*3/uL (ref 0–0.1)
BASOS PCT: 0 %
Eosinophils Absolute: 0 10*3/uL (ref 0–0.7)
Eosinophils Relative: 0 %
HEMATOCRIT: 37.7 % — AB (ref 40.0–52.0)
Hemoglobin: 13.5 g/dL (ref 13.0–18.0)
Lymphocytes Relative: 12 %
Lymphs Abs: 0.7 10*3/uL — ABNORMAL LOW (ref 1.0–3.6)
MCH: 31.9 pg (ref 26.0–34.0)
MCHC: 35.8 g/dL (ref 32.0–36.0)
MCV: 89.1 fL (ref 80.0–100.0)
MONO ABS: 0.6 10*3/uL (ref 0.2–1.0)
Monocytes Relative: 11 %
NEUTROS ABS: 4.5 10*3/uL (ref 1.4–6.5)
Neutrophils Relative %: 77 %
PLATELETS: 133 10*3/uL — AB (ref 150–440)
RBC: 4.24 MIL/uL — ABNORMAL LOW (ref 4.40–5.90)
RDW: 12.6 % (ref 11.5–14.5)
WBC: 6 10*3/uL (ref 3.8–10.6)

## 2017-07-03 LAB — COMPREHENSIVE METABOLIC PANEL
ALBUMIN: 3.5 g/dL (ref 3.5–5.0)
ALK PHOS: 97 U/L (ref 38–126)
ALT: 29 U/L (ref 17–63)
ANION GAP: 10 (ref 5–15)
AST: 27 U/L (ref 15–41)
BILIRUBIN TOTAL: 1.6 mg/dL — AB (ref 0.3–1.2)
BUN: 17 mg/dL (ref 6–20)
CALCIUM: 8.6 mg/dL — AB (ref 8.9–10.3)
CO2: 22 mmol/L (ref 22–32)
Chloride: 101 mmol/L (ref 101–111)
Creatinine, Ser: 0.65 mg/dL (ref 0.61–1.24)
GFR calc Af Amer: >60 mL/min (ref >60)
GFR calc non Af Amer: >60 mL/min (ref >60)
GLUCOSE: 319 mg/dL — AB (ref 65–99)
Potassium: 4.1 mmol/L (ref 3.5–5.1)
Sodium: 133 mmol/L — ABNORMAL LOW (ref 135–145)
TOTAL PROTEIN: 7.2 g/dL (ref 6.5–8.1)

## 2017-07-03 LAB — BLOOD GAS, VENOUS
Acid-Base Excess: 2.8 mmol/L — ABNORMAL HIGH (ref 0.0–2.0)
Bicarbonate: 26.1 mmol/L (ref 20.0–28.0)
O2 SAT: 87.3 %
Patient temperature: 37
pCO2, Ven: 35 mmHg — ABNORMAL LOW (ref 44.0–60.0)
pH, Ven: 7.48 — ABNORMAL HIGH (ref 7.250–7.430)
pO2, Ven: 49 mmHg — ABNORMAL HIGH (ref 32.0–45.0)

## 2017-07-03 LAB — TROPONIN I: Troponin I: <0.03 ng/mL (ref <0.03)

## 2017-07-03 LAB — LACTIC ACID, PLASMA: Lactic Acid, Venous: 1 mmol/L (ref 0.5–1.9)

## 2017-07-03 MED ORDER — BISACODYL 5 MG PO TBEC: 5.0000 mg | DELAYED_RELEASE_TABLET | Freq: Every day | ORAL | Status: DC | PRN

## 2017-07-03 MED ORDER — TAMSULOSIN HCL 0.4 MG PO CAPS
0.4000 mg | ORAL_CAPSULE | Freq: Every day | ORAL | Status: DC
  Administered 2017-07-04 – 2017-07-10 (×7): 0.4 mg via ORAL
  Filled 2017-07-03 (×7): qty 1

## 2017-07-03 MED ORDER — QUINAPRIL HCL 10 MG PO TABS
20.0000 mg | ORAL_TABLET | Freq: Every day | ORAL | Status: DC
  Administered 2017-07-04 – 2017-07-10 (×6): 20 mg via ORAL
  Filled 2017-07-03 (×8): qty 2

## 2017-07-03 MED ORDER — SODIUM CHLORIDE 0.9 % IV SOLN
INTRAVENOUS | Status: DC
  Administered 2017-07-04 – 2017-07-09 (×7): via INTRAVENOUS

## 2017-07-03 MED ORDER — SODIUM CHLORIDE 0.9 % IV BOLUS (SEPSIS)
1000.0000 mL | Freq: Once | INTRAVENOUS | Status: AC
  Administered 2017-07-03: 1000 mL via INTRAVENOUS

## 2017-07-03 MED ORDER — FLUTICASONE PROPIONATE 50 MCG/ACT NA SUSP
1.0000 | Freq: Every day | NASAL | Status: DC
  Administered 2017-07-04 – 2017-07-05 (×2): 1 via NASAL
  Filled 2017-07-03: qty 16

## 2017-07-03 MED ORDER — HYDROCODONE-ACETAMINOPHEN 5-325 MG PO TABS: 1.0000 | ORAL_TABLET | ORAL | Status: DC | PRN

## 2017-07-03 MED ORDER — ACETAMINOPHEN 650 MG RE SUPP: 650.0000 mg | Freq: Four times a day (QID) | RECTAL | Status: DC | PRN

## 2017-07-03 MED ORDER — SODIUM CHLORIDE 0.9 % IV SOLN
2.0000 g | INTRAVENOUS | Status: DC
  Administered 2017-07-03 – 2017-07-04 (×2): 2 g via INTRAVENOUS
  Filled 2017-07-03 (×3): qty 20

## 2017-07-03 MED ORDER — SODIUM CHLORIDE 0.9 % IV SOLN
500.0000 mg | INTRAVENOUS | Status: DC
  Administered 2017-07-03 – 2017-07-05 (×3): 500 mg via INTRAVENOUS
  Filled 2017-07-03 (×4): qty 500

## 2017-07-03 MED ORDER — ALLOPURINOL 300 MG PO TABS
300.0000 mg | ORAL_TABLET | Freq: Every day | ORAL | Status: DC
  Administered 2017-07-04 – 2017-07-10 (×7): 300 mg via ORAL
  Filled 2017-07-03 (×7): qty 1

## 2017-07-03 MED ORDER — HEPARIN SODIUM (PORCINE) 5000 UNIT/ML IJ SOLN
5000.0000 [IU] | Freq: Three times a day (TID) | INTRAMUSCULAR | Status: DC
  Administered 2017-07-04 – 2017-07-10 (×19): 5000 [IU] via SUBCUTANEOUS
  Filled 2017-07-03 (×19): qty 1

## 2017-07-03 MED ORDER — TRAZODONE HCL 50 MG PO TABS: 25.0000 mg | ORAL_TABLET | Freq: Every evening | ORAL | Status: DC | PRN

## 2017-07-03 MED ORDER — PANTOPRAZOLE SODIUM 40 MG PO TBEC
40.0000 mg | DELAYED_RELEASE_TABLET | Freq: Every day | ORAL | Status: DC
  Administered 2017-07-04 – 2017-07-10 (×7): 40 mg via ORAL
  Filled 2017-07-03 (×7): qty 1

## 2017-07-03 MED ORDER — DOCUSATE SODIUM 100 MG PO CAPS
100.0000 mg | ORAL_CAPSULE | Freq: Two times a day (BID) | ORAL | Status: DC
  Administered 2017-07-04 – 2017-07-10 (×13): 100 mg via ORAL
  Filled 2017-07-03 (×13): qty 1

## 2017-07-03 MED ORDER — ACETAMINOPHEN 325 MG PO TABS
650.0000 mg | ORAL_TABLET | Freq: Four times a day (QID) | ORAL | Status: DC | PRN
  Administered 2017-07-04 – 2017-07-06 (×7): 650 mg via ORAL
  Filled 2017-07-03 (×7): qty 2

## 2017-07-03 MED ORDER — ONDANSETRON HCL 4 MG PO TABS: 4.0000 mg | ORAL_TABLET | Freq: Four times a day (QID) | ORAL | Status: DC | PRN

## 2017-07-03 MED ORDER — ACETAMINOPHEN 500 MG PO TABS: 1000.0000 mg | ORAL_TABLET | Freq: Once | ORAL | Status: DC

## 2017-07-03 MED ORDER — IBUPROFEN 800 MG PO TABS
800.0000 mg | ORAL_TABLET | Freq: Once | ORAL | Status: AC
  Administered 2017-07-03: 800 mg via ORAL
  Filled 2017-07-03: qty 1

## 2017-07-03 MED ORDER — LORATADINE 10 MG PO TABS
10.0000 mg | ORAL_TABLET | Freq: Every day | ORAL | Status: DC
Start: 2017-07-04 — End: 2017-07-06
  Administered 2017-07-04 – 2017-07-06 (×3): 10 mg via ORAL
  Filled 2017-07-03 (×3): qty 1

## 2017-07-03 MED ORDER — LEVOTHYROXINE SODIUM 88 MCG PO TABS
88.0000 ug | ORAL_TABLET | Freq: Every day | ORAL | Status: DC
  Administered 2017-07-04 – 2017-07-10 (×6): 88 ug via ORAL
  Filled 2017-07-03 (×7): qty 1

## 2017-07-03 MED ORDER — ONDANSETRON HCL 4 MG/2ML IJ SOLN: 4.0000 mg | Freq: Four times a day (QID) | INTRAMUSCULAR | Status: DC | PRN

## 2017-07-03 NOTE — ED Notes (Signed)
Patient placed on 2L O2, sats increased to 93%. Patient now on 3L O2 via nasal cannula.

## 2017-07-03 NOTE — ED Triage Notes (Signed)
Patient to ER for fever x5 days with shortness of breath and weakness. Patient was seen at [facility] today, was sent to ER for further eval of possible PNA. Patient reports taking Tylenol at 1800 ( ).  Patient speaks Spanish only, interpreter present.

## 2017-07-03 NOTE — Progress Notes (Signed)
CODE SEPSIS - PHARMACY COMMUNICATION  **Broad Spectrum Antibiotics should be administered within 1 hour of Sepsis diagnosis**  Time Code Sepsis Called/Page Received: 5/28 2120   Antibiotics Ordered: 5/28 2121  Time of 1st antibiotic administration: 5/28 2148  Additional action taken by pharmacy:   If necessary, Name of Provider/Nurse Contacted:     Erich Montane ,PharmD Clinical Pharmacist  07/03/2017  10:40 PM

## 2017-07-03 NOTE — H&P (Signed)
[facility] [place] [facility]   PATIENT NAME: Alvin [name]    MR#:  [id number]  DATE OF BIRTH:  11-01-1950  DATE OF ADMISSION:  07/03/2017  PRIMARY CARE PHYSICIAN: [facility]   REQUESTING/REFERRING PHYSICIAN:   CHIEF COMPLAINT:   Chief Complaint  Patient presents with  . Fever  . Weakness  . Shortness of Breath    HISTORY OF PRESENT ILLNESS: Alvin [name]  is a 67 y.o. male with a known history of diabetes type 2 and hypertension. Patient presented to emergency room for fever and chills, productive cough, generalized weakness and fatigue going on for the past 24 hours, gradually getting worse.  At the arrival to emergency room he was noted to be febrile with a temperature at 103.3, tachypneic with respiratory rate at 25, and tachycardic with a heart rate at 106.  Oxygen saturation was 82 on room air. Blood test done emergency room show WBC at 6000, sodium level 133, glucose level 319 and lactic acid level at 1. UA is negative for UTI.  Chest x-ray, evaluated by myself, reveals right middle and lower lobe opacity. Patient is admitted for further evaluation and treatment.   PAST MEDICAL HISTORY:   Past Medical History:  Diagnosis Date  . Anxiety   . Diabetes mellitus without complication (HCC)   . Hypertension     PAST SURGICAL HISTORY:  Past Surgical History:  Procedure Laterality Date  . COLON SURGERY  approx 2007   cyst removed   . COLONOSCOPY WITH PROPOFOL N/A 03/06/2016   Procedure: COLONOSCOPY WITH PROPOFOL;  Surgeon: Midge Minium, MD;  Location: [facility];  Service: Endoscopy;  Laterality: N/A;  . ESOPHAGOGASTRODUODENOSCOPY (EGD) WITH PROPOFOL N/A 08/19/2015   Procedure: ESOPHAGOGASTRODUODENOSCOPY (EGD) WITH PROPOFOL;  Surgeon: Midge Minium, MD;  Location: [facility];  Service: Endoscopy;  Laterality: N/A;  . ESOPHAGOGASTRODUODENOSCOPY (EGD) WITH PROPOFOL N/A 03/06/2016   Midge Minium, MD  - PT NEEDS REPEAT IN 03/2017  . PANCREATIC CYST EXCISION      SOCIAL HISTORY:  Social History   Tobacco Use  . Smoking status: Never Smoker  . Smokeless tobacco: Never Used  Substance Use Topics  . Alcohol use: Yes    Alcohol/week: 0.0 oz    Comment: occas    FAMILY HISTORY:  Family History  Problem Relation Age of Onset  . Alcohol abuse Neg Hx   . Arthritis Neg Hx   . Asthma Neg Hx   . Birth defects Neg Hx   . Cancer Neg Hx   . COPD Neg Hx   . Depression Neg Hx   . Diabetes Neg Hx   . Drug abuse Neg Hx   . Early death Neg Hx   . Hearing loss Neg Hx   . Heart disease Neg Hx   . Hyperlipidemia Neg Hx   . Hypertension Neg Hx   . Kidney disease Neg Hx   . Learning disabilities Neg Hx   . Mental illness Neg Hx   . Mental retardation Neg Hx   . Stroke Neg Hx   . Miscarriages / Stillbirths Neg Hx   . Vision loss Neg Hx   . Varicose Veins Neg Hx     DRUG ALLERGIES: No Known Allergies  REVIEW OF SYSTEMS:   CONSTITUTIONAL: Positive for fever, chills, fatigue and generalized weakness.  EYES: No blurred or double vision.  EARS, NOSE, AND THROAT: No tinnitus or ear pain.  RESPIRATORY: Positive for productive cough, shortness of  breath.  No wheezing or hemoptysis.  CARDIOVASCULAR: No chest pain, orthopnea, edema.  GASTROINTESTINAL: No nausea, vomiting, diarrhea or abdominal pain.  GENITOURINARY: No dysuria, hematuria.  ENDOCRINE: No polyuria, nocturia,  HEMATOLOGY: No bleeding. SKIN: No rash or lesion. MUSCULOSKELETAL: No joint pain or arthritis.   NEUROLOGIC: No focal weakness.  PSYCHIATRY: No anxiety or depression.   MEDICATIONS AT HOME:  Prior to Admission medications   Medication Sig Start Date End Date Taking? Authorizing Provider  allopurinol (ZYLOPRIM) 300 MG tablet Take 300 mg by mouth daily.    Yes [provider]  glipiZIDE (GLUCOTROL) 5 MG tablet Take 5 mg by mouth 2 (two) times daily before a meal.    Yes [provider]  levothyroxine  (SYNTHROID, LEVOTHROID) 88 MCG tablet Take 1 tablet by mouth daily. 05/10/17 05/10/18 Yes [provider]  metFORMIN (GLUCOPHAGE) 500 MG tablet Take 1 tablet by mouth 2 (two) times daily. 05/10/17 05/10/18 Yes [provider]  pantoprazole (PROTONIX) 40 MG tablet Take 1 tablet (40 mg total) by mouth daily before breakfast. 08/19/15  Yes Midge Minium, MD  quinapril (ACCUPRIL) 20 MG tablet Take 20 mg by mouth daily.   Yes [provider]  tamsulosin (FLOMAX) 0.4 MG CAPS capsule Take 1 capsule by mouth daily. 05/10/17 05/10/18 Yes [provider]  cetirizine (ZYRTEC) 10 MG tablet Take 10 mg by mouth.    [provider]  fluticasone (FLONASE) 50 MCG/ACT nasal spray 1 spray by Each Nare route daily.    [provider]      PHYSICAL EXAMINATION:   VITAL SIGNS: Blood pressure 135/69, pulse 92, temperature (!) 103.3 F (39.6 C), temperature source Oral, resp. rate 20, height  (1.651 m), weight 95.3 kg (210 lb), SpO2 96 %.  GENERAL:  67 y.o.-year-old patient lying in the bed with moderate distress, secondary to fever and chills.  EYES: Pupils equal, round, reactive to light and accommodation. No scleral icterus. HEENT: Head atraumatic, normocephalic. Oropharynx and nasopharynx clear.  NECK:  Supple, no jugular venous distention. No thyroid enlargement, no tenderness.  LUNGS: Reduced breath sounds bilaterally, no wheezing, rales,rhonchi or crepitation. No use of accessory muscles of respiration.  CARDIOVASCULAR: S1, S2 normal. No murmurs, rubs, or gallops.  ABDOMEN: Soft, nontender, nondistended. Bowel sounds present. No organomegaly or mass.  EXTREMITIES: No pedal edema, cyanosis, or clubbing.  NEUROLOGIC: No focal weakness. PSYCHIATRIC: The patient is alert, but lethargic.  SKIN: No obvious rash, lesion, or ulcer.   LABORATORY PANEL:   CBC Recent Labs  Lab 07/03/17 2044  WBC 6.0  HGB 13.5  HCT 37.7*  PLT 133*  MCV 89.1  MCH 31.9  MCHC 35.8   RDW 12.6  LYMPHSABS 0.7*  MONOABS 0.6  EOSABS 0.0  BASOSABS 0.0   ------------------------------------------------------------------------------------------------------------------  Chemistries  Recent Labs  Lab 07/03/17 2044  NA 133*  K 4.1  CL 101  CO2 22  GLUCOSE 319*  BUN 17  CREATININE 0.65  CALCIUM 8.6*  AST 27  ALT 29  ALKPHOS 97  BILITOT 1.6*   ------------------------------------------------------------------------------------------------------------------ estimated creatinine clearance is 96.4 mL/min (by C-G formula based on SCr of 0.65 mg/dL). ------------------------------------------------------------------------------------------------------------------ No results for input(s): TSH, T4TOTAL, T3FREE, THYROIDAB in the last 72 hours.  Invalid input(s): FREET3   Coagulation profile No results for input(s): INR, PROTIME in the last 168 hours. ------------------------------------------------------------------------------------------------------------------- No results for input(s): DDIMER in the last 72 hours. -------------------------------------------------------------------------------------------------------------------  Cardiac Enzymes Recent Labs  Lab 07/03/17 2132  TROPONINI <0.03   ------------------------------------------------------------------------------------------------------------------ Ignacia Felling  input(s): POCBNP  ---------------------------------------------------------------------------------------------------------------  Urinalysis    Component Value Date/Time   COLORURINE YELLOW (A) 02/12/2016 1013   APPEARANCEUR CLEAR (A) 02/12/2016 1013   APPEARANCEUR Clear 06/02/2014 2252   LABSPEC 1.023 02/12/2016 1013   LABSPEC 1.018 06/02/2014 2252   PHURINE 5.0 02/12/2016 1013   GLUCOSEU >=500 (A) 02/12/2016 1013   GLUCOSEU >=500 06/02/2014 2252   HGBUR NEGATIVE 02/12/2016 1013   BILIRUBINUR NEGATIVE 02/12/2016 1013   BILIRUBINUR  Negative 06/02/2014 2252   KETONESUR NEGATIVE 02/12/2016 1013   PROTEINUR NEGATIVE 02/12/2016 1013   NITRITE NEGATIVE 02/12/2016 1013   LEUKOCYTESUR NEGATIVE 02/12/2016 1013   LEUKOCYTESUR Negative 06/02/2014 2252     RADIOLOGY: Dg Chest 2 View  Result Date: 07/03/2017 CLINICAL DATA:  Fever, shortness of breath, weakness EXAM: CHEST - 2 VIEW COMPARISON:  06/30/2015 FINDINGS: Right lower lung opacity, suspicious for right middle and lower lobe pneumonia. Mild left basilar atelectasis. No definite pleural effusions. No pneumothorax. The heart is top-normal in size. IMPRESSION: Right lower lung opacity, suspicious for right middle and lower lobe pneumonia. Electronically Signed   By: Charline Bills M.D.   On: 07/03/2017 20:55    EKG: Orders placed or performed during the hospital encounter of 07/03/17  . EKG 12-Lead  . EKG 12-Lead  . ED EKG 12-Lead  . ED EKG 12-Lead    IMPRESSION AND PLAN:  1.  Sepsis, secondary to community-acquired pneumonia.  We will start IV fluid resuscitation, broad-spectrum IV antibiotics, oxygen therapy and nebulizer treatments.  Continue to monitor clinically closely and follow lactic acid level. 2.  CAP, see treatment as above under #1. 3.  Diabetes type 2.  Will start low-carb diet and monitor blood sugars before meals and at bedtime.  Will use insulin treatment during the hospital stay. 4.  Hypertension, stable, will restart home medications.  All the records are reviewed and case discussed with ED provider. Management plans discussed with the patient and he is in agreement.  CODE STATUS: FULL    TOTAL TIME TAKING CARE OF THIS PATIENT: 45 minutes.    Cammy Copa M.D on 07/03/2017 at 10:31 PM  Between 7am to 6pm - Pager - [phone]  After 6pm go to [email or web address] - password [email or web address] [facility]  [facility]  Office  [phone]  CC: Primary care physician; [facility]

## 2017-07-03 NOTE — ED Provider Notes (Addendum)
[facility] Emergency Department Provider Note  ____________________________________________  Time seen: Approximately 9:22 PM  I have reviewed the triage vital signs and the nursing notes.   HISTORY  Chief Complaint Fever; Weakness; and Shortness of Breath    HPI Alvin [name] is a 67 y.o. male history of HTN, DM, presenting with fever, shaking chills, general malaise, cough.  The patient states he was seen here yesterday for sore throat although he is not having throat pain anymore.  He had normal vital signs and a strep test that was negative yesterday.  Since then, he has had progressively worsening weakness.  He has not tried anything for his symptoms.  On arrival today, the patient is hypoxic to the 80s on room air, febrile to 103.3 and tachycardic to 106.  Past Medical History:  Diagnosis Date  . Anxiety   . Diabetes mellitus without complication (HCC)   . Hypertension     Patient Active Problem List   Diagnosis Date Noted  . Hx of colonic polyps   . Gastric polyp   . Heartburn   . Peptic ulcer of stomach   . Gastritis   . Abdominal pain, epigastric   . Panic attack 07/12/2015  . Right lower quadrant abdominal pain 12/01/2014  . Gallstones 12/01/2014  . Encounter for screening for malignant neoplasm of colon 01/24/2013  . Abdominal bloating 01/24/2013    Past Surgical History:  Procedure Laterality Date  . COLON SURGERY  approx 2007   cyst removed   . COLONOSCOPY WITH PROPOFOL N/A 03/06/2016   Procedure: COLONOSCOPY WITH PROPOFOL;  Surgeon: Midge Minium, MD;  Location: [facility];  Service: Endoscopy;  Laterality: N/A;  . ESOPHAGOGASTRODUODENOSCOPY (EGD) WITH PROPOFOL N/A 08/19/2015   Procedure: ESOPHAGOGASTRODUODENOSCOPY (EGD) WITH PROPOFOL;  Surgeon: Midge Minium, MD;  Location: [facility];  Service: Endoscopy;  Laterality: N/A;  . ESOPHAGOGASTRODUODENOSCOPY (EGD) WITH PROPOFOL N/A 03/06/2016   Midge Minium, MD - PT NEEDS  REPEAT IN 03/2017  . PANCREATIC CYST EXCISION      Current Outpatient Rx  . Order #: [id number] Class: Historical Med  . Order #: [id number] Class: Historical Med  . Order #: [id number] Class: Historical Med  . Order #: [id number] Class: Historical Med  . Order #: [id number] Class: Normal  . Order #: [id number] Class: Historical Med    Allergies Patient has no known allergies.  Family History  Problem Relation Age of Onset  . Alcohol abuse Neg Hx   . Arthritis Neg Hx   . Asthma Neg Hx   . Birth defects Neg Hx   . Cancer Neg Hx   . COPD Neg Hx   . Depression Neg Hx   . Diabetes Neg Hx   . Drug abuse Neg Hx   . Early death Neg Hx   . Hearing loss Neg Hx   . Heart disease Neg Hx   . Hyperlipidemia Neg Hx   . Hypertension Neg Hx   . Kidney disease Neg Hx   . Learning disabilities Neg Hx   . Mental illness Neg Hx   . Mental retardation Neg Hx   . Stroke Neg Hx   . Miscarriages / Stillbirths Neg Hx   . Vision loss Neg Hx   . Varicose Veins Neg Hx     Social History Social History   Tobacco Use  . Smoking status: Never Smoker  . Smokeless tobacco: Never Used  Substance Use Topics  . Alcohol use: Yes    Alcohol/week: 0.0 oz    Comment: occas  .  Drug use: No    Review of Systems Constitutional:   General malaise.  Positive fever and shaking chills. Eyes: No visual changes. ENT: The patient was seen here yesterday for sore throat but today states he is no sore throat.. No congestion or rhinorrhea. Cardiovascular: Denies chest pain. Denies palpitations. Respiratory: Denies shortness of breath.  Positive cough. Gastrointestinal: No abdominal pain.  No nausea, no vomiting.  No diarrhea.  No constipation. Genitourinary: Negative for dysuria.  No urinary frequency.   Musculoskeletal: Negative for back pain. Skin: Negative for rash. Neurological: Negative for headaches. No focal numbness, tingling or weakness.     ____________________________________________   PHYSICAL  EXAM:  VITAL SIGNS: ED Triage Vitals  Enc Vitals Group     BP 07/03/17 2036 (!) 141/63     Pulse Rate 07/03/17 2036 (!) 106     Resp 07/03/17 2036 20     Temp 07/03/17 2036 (!) 103.3 F (39.6 C)     Temp Source 07/03/17 2036 Oral     SpO2 07/03/17 2036 90 %     Weight 07/03/17 2037 210 lb (95.3 kg)     Height 07/03/17 2037  (1.651 m)     Head Circumference --      Peak Flow --      Pain Score 07/03/17 2036 0     Pain Loc --      Pain Edu? --      Excl. in GC? --     Constitutional: Alert and oriented. Uncomfortable appearing but in no acute distress. Answers questions appropriately. Eyes: Conjunctivae are normal.  EOMI. No scleral icterus. Head: Atraumatic. Nose: No congestion/rhinnorhea. Mouth/Throat: Mucous membranes are moist.  No posterior pharyngeal erythema, tonsillar swelling or exudate.  The posterior palate is symmetric and the uvula is midline. Neck: No stridor.  Supple.  No JVD.  No meningismus. Cardiovascular: Fast rate, regular rhythm.  Holosystolic murmur without rubs or gallops.  Respiratory: Normal respiratory effort.  No accessory muscle use or retractions. Lungs CTAB.  No wheezes, rales or ronchi. Gastrointestinal: Soft, nontender and nondistended.  No guarding or rebound.  No peritoneal signs. Musculoskeletal: No LE edema. No ttp in the calves or palpable cords.  Negative Homan's sign. Neurologic:  A&Ox3.  Speech is clear.  Face and smile are symmetric.  EOMI.  Moves all extremities well. Skin:  Skin is warm, dry and intact. No rash noted. Psychiatric: Mood and affect are normal. Speech and behavior are normal.  Normal judgement.  ____________________________________________   LABS (all labs ordered are listed, but only abnormal results are displayed)  Labs Reviewed  COMPREHENSIVE METABOLIC PANEL - Abnormal; Notable for the following components:      Result Value   Sodium 133 (*)    Glucose, Bld 319 (*)    Calcium 8.6 (*)    Total Bilirubin 1.6  (*)    All other components within normal limits  CBC WITH DIFFERENTIAL/PLATELET - Abnormal; Notable for the following components:   RBC 4.24 (*)    HCT 37.7 (*)    Platelets 133 (*)    Lymphs Abs 0.7 (*)    All other components within normal limits  CULTURE, BLOOD (ROUTINE X 2)  CULTURE, BLOOD (ROUTINE X 2)  LACTIC ACID, PLASMA  LACTIC ACID, PLASMA  URINALYSIS, COMPLETE (UACMP) WITH MICROSCOPIC  TROPONIN I  BLOOD GAS, VENOUS   ____________________________________________  EKG  ED ECG REPORT I, Rockne Menghini, the attending physician, personally viewed and interpreted this ECG.   Date:  07/03/2017  EKG Time: 2038  Rate: 103  Rhythm: sinus tachycardia; RBBB  Axis: normal  Intervals:none  ST&T Change: No STEMI   ____________________________________________  RADIOLOGY  Dg Chest 2 View  Result Date: 07/03/2017 CLINICAL DATA:  Fever, shortness of breath, weakness EXAM: CHEST - 2 VIEW COMPARISON:  06/30/2015 FINDINGS: Right lower lung opacity, suspicious for right middle and lower lobe pneumonia. Mild left basilar atelectasis. No definite pleural effusions. No pneumothorax. The heart is top-normal in size. IMPRESSION: Right lower lung opacity, suspicious for right middle and lower lobe pneumonia. Electronically Signed   By: Charline Bills M.D.   On: 07/03/2017 20:55    ____________________________________________   PROCEDURES  Procedure(s) performed: None  Procedures  Critical Care performed: Yes, see critical care note(s) ____________________________________________   INITIAL IMPRESSION / ASSESSMENT AND PLAN / ED COURSE  Pertinent labs & imaging results that were available during my care of the patient were reviewed by me and considered in my medical decision making (see chart for details).  67 y.o. male with a history of HTN and DM presenting with sepsis criteria, with hypoxia and cough most likely suggestive of pneumonia.  The patient will receive  immediate empiric antibiotics as well as intravenous fluids and an antipyretic.  He does also have a murmur and his examination so endocarditis is considered.  A urinalysis will also be sent.  The patient be admitted to the hospital for continued evaluation and treatment.  CRITICAL CARE Performed by: Rockne Menghini   Total critical care time: 45 minutes  Critical care time was exclusive of separately billable procedures and treating other patients.  Critical care was necessary to treat or prevent imminent or life-threatening deterioration.  Critical care was time spent personally by me on the following activities: development of treatment plan with patient and/or surrogate as well as nursing, discussions with consultants, evaluation of patient's response to treatment, examination of patient, obtaining history from patient or surrogate, ordering and performing treatments and interventions, ordering and review of laboratory studies, ordering and review of radiographic studies, pulse oximetry and re-evaluation of patient's condition.    ____________________________________________  FINAL CLINICAL IMPRESSION(S) / ED DIAGNOSES  Final diagnoses:  Sepsis, due to unspecified organism (HCC)  Hypoxia  Hyperglycemia  Community acquired pneumonia of right lower lobe of lung (HCC)         NEW MEDICATIONS STARTED DURING THIS VISIT:  New Prescriptions   No medications on file      Rockne Menghini, MD 07/03/17 2134    Rockne Menghini, MD 07/21/17 [phone]

## 2017-07-04 LAB — BASIC METABOLIC PANEL
ANION GAP: 7 (ref 5–15)
BUN: 13 mg/dL (ref 6–20)
CHLORIDE: 107 mmol/L (ref 101–111)
CO2: 22 mmol/L (ref 22–32)
Calcium: 7.6 mg/dL — ABNORMAL LOW (ref 8.9–10.3)
Creatinine, Ser: 0.78 mg/dL (ref 0.61–1.24)
GFR calc Af Amer: >60 mL/min (ref >60)
GFR calc non Af Amer: >60 mL/min (ref >60)
GLUCOSE: 196 mg/dL — AB (ref 65–99)
POTASSIUM: 3.7 mmol/L (ref 3.5–5.1)
Sodium: 136 mmol/L (ref 135–145)

## 2017-07-04 LAB — GLUCOSE, CAPILLARY
GLUCOSE-CAPILLARY: 228 mg/dL — AB (ref 65–99)
Glucose-Capillary: 180 mg/dL — ABNORMAL HIGH (ref 65–99)
Glucose-Capillary: 285 mg/dL — ABNORMAL HIGH (ref 65–99)
Glucose-Capillary: 285 mg/dL — ABNORMAL HIGH (ref 65–99)

## 2017-07-04 LAB — CBC
HCT: 35.6 % — ABNORMAL LOW (ref 40.0–52.0)
HEMOGLOBIN: 12.4 g/dL — AB (ref 13.0–18.0)
MCH: 31.8 pg (ref 26.0–34.0)
MCHC: 34.9 g/dL (ref 32.0–36.0)
MCV: 90.9 fL (ref 80.0–100.0)
Platelets: 136 10*3/uL — ABNORMAL LOW (ref 150–440)
RBC: 3.91 MIL/uL — ABNORMAL LOW (ref 4.40–5.90)
RDW: 12.9 % (ref 11.5–14.5)
WBC: 6.5 10*3/uL (ref 3.8–10.6)

## 2017-07-04 LAB — LACTIC ACID, PLASMA: Lactic Acid, Venous: 0.8 mmol/L (ref 0.5–1.9)

## 2017-07-04 MED ORDER — INSULIN ASPART 100 UNIT/ML ~~LOC~~ SOLN
0.0000 [IU] | Freq: Three times a day (TID) | SUBCUTANEOUS | Status: DC
  Administered 2017-07-04: 3 [IU] via SUBCUTANEOUS
  Administered 2017-07-04: 8 [IU] via SUBCUTANEOUS
  Administered 2017-07-04 – 2017-07-05 (×2): 5 [IU] via SUBCUTANEOUS
  Administered 2017-07-05: 8 [IU] via SUBCUTANEOUS
  Administered 2017-07-05 – 2017-07-06 (×2): 3 [IU] via SUBCUTANEOUS
  Administered 2017-07-06: 5 [IU] via SUBCUTANEOUS
  Administered 2017-07-06: 8 [IU] via SUBCUTANEOUS
  Administered 2017-07-07: 5 [IU] via SUBCUTANEOUS
  Administered 2017-07-07: 3 [IU] via SUBCUTANEOUS
  Administered 2017-07-07 – 2017-07-08 (×2): 5 [IU] via SUBCUTANEOUS
  Administered 2017-07-08: 8 [IU] via SUBCUTANEOUS
  Administered 2017-07-08: 5 [IU] via SUBCUTANEOUS
  Administered 2017-07-09 (×3): 3 [IU] via SUBCUTANEOUS
  Administered 2017-07-10: 2 [IU] via SUBCUTANEOUS
  Filled 2017-07-04 (×20): qty 1

## 2017-07-04 MED ORDER — IPRATROPIUM-ALBUTEROL 0.5-2.5 (3) MG/3ML IN SOLN
3.0000 mL | Freq: Four times a day (QID) | RESPIRATORY_TRACT | Status: DC
  Administered 2017-07-04 – 2017-07-06 (×10): 3 mL via RESPIRATORY_TRACT
  Filled 2017-07-04 (×10): qty 3

## 2017-07-04 MED ORDER — INSULIN ASPART 100 UNIT/ML ~~LOC~~ SOLN
0.0000 [IU] | Freq: Every day | SUBCUTANEOUS | Status: DC
  Administered 2017-07-04: 3 [IU] via SUBCUTANEOUS
  Administered 2017-07-05 – 2017-07-09 (×4): 2 [IU] via SUBCUTANEOUS
  Filled 2017-07-04 (×6): qty 1

## 2017-07-04 MED ORDER — PNEUMOCOCCAL VAC POLYVALENT 25 MCG/0.5ML IJ INJ
0.5000 mL | INJECTION | INTRAMUSCULAR | Status: AC
  Administered 2017-07-05: 0.5 mL via INTRAMUSCULAR
  Filled 2017-07-04: qty 0.5

## 2017-07-04 NOTE — Progress Notes (Signed)
[employer] - [place] at [facility]   PATIENT NAME: Alvin [name]    MR#:  [id number]  DATE OF BIRTH:  [birth year]  SUBJECTIVE:  CHIEF COMPLAINT:   Chief Complaint  Patient presents with  . Fever  . Weakness  . Shortness of Breath  Patient feeling better, no events overnight per nursing staff  REVIEW OF SYSTEMS:  CONSTITUTIONAL: No fever, fatigue or weakness.  EYES: No blurred or double vision.  EARS, NOSE, AND THROAT: No tinnitus or ear pain.  RESPIRATORY: No cough, shortness of breath, wheezing or hemoptysis.  CARDIOVASCULAR: No chest pain, orthopnea, edema.  GASTROINTESTINAL: No nausea, vomiting, diarrhea or abdominal pain.  GENITOURINARY: No dysuria, hematuria.  ENDOCRINE: No polyuria, nocturia,  HEMATOLOGY: No anemia, easy bruising or bleeding SKIN: No rash or lesion. MUSCULOSKELETAL: No joint pain or arthritis.   NEUROLOGIC: No tingling, numbness, weakness.  PSYCHIATRY: No anxiety or depression.   ROS  DRUG ALLERGIES:  No Known Allergies  VITALS:  Blood pressure (!) 142/71, pulse 84, temperature 99.8 F (37.7 C), resp. rate 20, height  (1.651 m), weight 101.4 kg (223 lb 8.7 oz), SpO2 96 %.  PHYSICAL EXAMINATION:  GENERAL:  67 y.o.-year-old patient lying in the bed with no acute distress.  EYES: Pupils equal, round, reactive to light and accommodation. No scleral icterus. Extraocular muscles intact.  HEENT: Head atraumatic, normocephalic. Oropharynx and nasopharynx clear.  NECK:  Supple, no jugular venous distention. No thyroid enlargement, no tenderness.  LUNGS: Normal breath sounds bilaterally, no wheezing, rales,rhonchi or crepitation. No use of accessory muscles of respiration.  CARDIOVASCULAR: S1, S2 normal. No murmurs, rubs, or gallops.  ABDOMEN: Soft, nontender, nondistended. Bowel sounds present. No organomegaly or mass.  EXTREMITIES: No pedal edema, cyanosis, or clubbing.  NEUROLOGIC: Cranial nerves II through XII are intact.  Muscle strength 5/5 in all extremities. Sensation intact. Gait not checked.  PSYCHIATRIC: The patient is alert and oriented x 3.  SKIN: No obvious rash, lesion, or ulcer.   Physical Exam LABORATORY PANEL:   CBC Recent Labs  Lab 07/04/17 0452  WBC 6.5  HGB 12.4*  HCT 35.6*  PLT 136*   ------------------------------------------------------------------------------------------------------------------  Chemistries  Recent Labs  Lab 07/03/17 2044 07/04/17 0452  NA 133* 136  K 4.1 3.7  CL 101 107  CO2 22 22  GLUCOSE 319* 196*  BUN 17 13  CREATININE 0.65 0.78  CALCIUM 8.6* 7.6*  AST 27  --   ALT 29  --   ALKPHOS 97  --   BILITOT 1.6*  --    ------------------------------------------------------------------------------------------------------------------  Cardiac Enzymes Recent Labs  Lab 07/03/17 2132  TROPONINI <0.03   ------------------------------------------------------------------------------------------------------------------  RADIOLOGY:  Dg Chest 2 View  Result Date: 07/03/2017 CLINICAL DATA:  Fever, shortness of breath, weakness EXAM: CHEST - 2 VIEW COMPARISON:  06/30/2015 FINDINGS: Right lower lung opacity, suspicious for right middle and lower lobe pneumonia. Mild left basilar atelectasis. No definite pleural effusions. No pneumothorax. The heart is top-normal in size. IMPRESSION: Right lower lung opacity, suspicious for right middle and lower lobe pneumonia. Electronically Signed   By: Charline Bills M.D.   On: 07/03/2017 20:55    ASSESSMENT AND PLAN:  *Acute sepsis Due to CAP Continue sepsis protocol, IV fluids for rehydration  *Acute CAP Stable Continue pneumonia protocol, empiric Rocephin/azithromycin, follow-up on cultures  *Chronic diabetes mellitus type 2 Stable Continue sliding scale insulin with Accu-Cheks per routine  *Chronic benign essential hypertension Stable on current regiment   Disposition Home in 1 to  2 days barring any  complications  All the records are reviewed and case discussed with Care Management/Social Workerr. Management plans discussed with the patient, family and they are in agreement.  CODE STATUS: full  TOTAL TIME TAKING CARE OF THIS PATIENT: 35 minutes.     POSSIBLE D/C IN 1-3 DAYS, DEPENDING ON CLINICAL CONDITION.   Evelena Asa Salary M.D on 07/04/2017   Between 7am to 6pm - Pager - [phone]  After 6pm go to [email or web address] - Social research officer, government  [facility] [place]  Office  [phone]  CC: Primary care physician; Center, [facility]  Note: This dictation was prepared with Nurse, children's dictation along with smaller phrase technology. Any transcriptional errors that result from this process are unintentional.

## 2017-07-04 NOTE — Progress Notes (Signed)
Pts temp 103.2, PRN Tylenol given. MD Salary notified. No new orders.

## 2017-07-04 NOTE — Progress Notes (Signed)
Pts temp 101.4, PRN Tylenol given. Recheck 99.4. MD Salary notified.

## 2017-07-05 ENCOUNTER — Inpatient Hospital Stay: Payer: Medicare Other

## 2017-07-05 LAB — PROCALCITONIN: Procalcitonin: 0.39 ng/mL

## 2017-07-05 LAB — GLUCOSE, CAPILLARY
GLUCOSE-CAPILLARY: 190 mg/dL — AB (ref 65–99)
GLUCOSE-CAPILLARY: 201 mg/dL — AB (ref 65–99)
GLUCOSE-CAPILLARY: 230 mg/dL — AB (ref 65–99)
Glucose-Capillary: 286 mg/dL — ABNORMAL HIGH (ref 65–99)
Glucose-Capillary: 241 mg/dL — ABNORMAL HIGH (ref 65–99)

## 2017-07-05 LAB — HIV ANTIBODY (ROUTINE TESTING W REFLEX): HIV Screen 4th Generation wRfx: NONREACTIVE

## 2017-07-05 LAB — STREP PNEUMONIAE URINARY ANTIGEN: STREP PNEUMO URINARY ANTIGEN: NEGATIVE

## 2017-07-05 MED ORDER — INSULIN GLARGINE 100 UNIT/ML ~~LOC~~ SOLN
10.0000 [IU] | Freq: Every day | SUBCUTANEOUS | Status: DC
  Administered 2017-07-05 – 2017-07-09 (×5): 10 [IU] via SUBCUTANEOUS
  Filled 2017-07-05 (×6): qty 0.1

## 2017-07-05 MED ORDER — INSULIN ASPART 100 UNIT/ML ~~LOC~~ SOLN
3.0000 [IU] | Freq: Three times a day (TID) | SUBCUTANEOUS | Status: DC
  Administered 2017-07-05 – 2017-07-10 (×14): 3 [IU] via SUBCUTANEOUS
  Filled 2017-07-05 (×14): qty 1

## 2017-07-05 MED ORDER — CEFTRIAXONE SODIUM 1 G IJ SOLR
1.0000 g | Freq: Every day | INTRAMUSCULAR | Status: DC
  Administered 2017-07-05: 1 g via INTRAVENOUS
  Filled 2017-07-05: qty 1
  Filled 2017-07-05: qty 10

## 2017-07-05 MED ORDER — IOHEXOL 300 MG/ML  SOLN
75.0000 mL | Freq: Once | INTRAMUSCULAR | Status: AC | PRN
  Administered 2017-07-05: 75 mL via INTRAVENOUS

## 2017-07-05 MED ORDER — SODIUM CHLORIDE 0.9 % IV SOLN
2.0000 g | Freq: Three times a day (TID) | INTRAVENOUS | Status: DC
  Filled 2017-07-05 (×2): qty 2

## 2017-07-05 NOTE — Progress Notes (Signed)
ID E note Admitted with fevers, chills, cough and weakness. On admit temp 103, sats 82%, wbc 6. CXR with multifocal PNA. CT confirms PNA. Started ceftriaxone and azithro. BCX neg. Has had recurrent fevers but BP seems stable . HIV negative.   Impression Acute CAP.  Rec  Cont ceftriaxone and azitrhomycin Check Strep PNA and legionella  urinary antigen

## 2017-07-05 NOTE — Progress Notes (Signed)
Temp 103.8 tylenol 650 mgm given.

## 2017-07-05 NOTE — Progress Notes (Signed)
Temp rechecked after tylenol temp was 103.2.

## 2017-07-05 NOTE — Progress Notes (Signed)
After receiving zithromax and rocephin pt's temp was 99.5. He was less restless and began to rest. Wife is at bedside

## 2017-07-05 NOTE — Progress Notes (Signed)
Inpatient Diabetes Program Recommendations  AACE/ADA: New Consensus Statement on Inpatient Glycemic Control (2015)  Target Ranges:  Prepandial:   less than 140 mg/dL      Peak postprandial:   less than 180 mg/dL (1-2 hours)      Critically ill patients:  140 - 180 mg/dL   Results for Alvin [name], Alvin [name] (MRN [id number]) as of 07/05/2017 11:06  Ref. Range 07/04/2017 08:06 07/04/2017 11:28 07/04/2017 16:47 07/04/2017 22:30  Glucose-Capillary Latest Ref Range: 65 - 99 mg/dL 409 (H) 811 (H) 914 (H) 285 (H)   Results for Alvin [name], Alvin [name] (MRN [id number]) as of 07/05/2017 11:06  Ref. Range 07/05/2017 07:53  Glucose-Capillary Latest Ref Range: 65 - 99 mg/dL 086 (H)     Home DM Meds: Metformin 500 mg BID        Glipizide 5 mg BID  Current Orders: Novolog Moderate Correction Scale/ SSI (0-15 units) TID AC + HS     MD- Please consider the following in-hospital insulin adjustments while home oral DM meds are on hold and patient having hyperglycemia:  1. Start low dose basal insulin: Lantus 10 units daily (0.1 units/kg dosing based on weight of 104 kg)  2. Start low dose Novolog Meal Coverage: Novolog 3 units TID with meals (hold if pt eats <50% of meal)      --Will follow patient during hospitalization--  Ambrose Finland RN, MSN, CDE Diabetes Coordinator Inpatient Glycemic Control Team Team Pager: [phone] (8a-5p)

## 2017-07-05 NOTE — Progress Notes (Signed)
[employer] - [place] at [facility]   PATIENT NAME: Alvin [name]    MR#:  [id number]  DATE OF BIRTH:  April 26, 1950  SUBJECTIVE:  CHIEF COMPLAINT:   Chief Complaint  Patient presents with  . Fever  . Weakness  . Shortness of Breath  Persistent high-grade fevers throughout the night, wife and daughter at the bedside, patient states that he feels a little better  REVIEW OF SYSTEMS:  CONSTITUTIONAL: No fever, fatigue or weakness.  EYES: No blurred or double vision.  EARS, NOSE, AND THROAT: No tinnitus or ear pain.  RESPIRATORY: No cough, shortness of breath, wheezing or hemoptysis.  CARDIOVASCULAR: No chest pain, orthopnea, edema.  GASTROINTESTINAL: No nausea, vomiting, diarrhea or abdominal pain.  GENITOURINARY: No dysuria, hematuria.  ENDOCRINE: No polyuria, nocturia,  HEMATOLOGY: No anemia, easy bruising or bleeding SKIN: No rash or lesion. MUSCULOSKELETAL: No joint pain or arthritis.   NEUROLOGIC: No tingling, numbness, weakness.  PSYCHIATRY: No anxiety or depression.   ROS  DRUG ALLERGIES:  No Known Allergies  VITALS:  Blood pressure (!) 143/76, pulse 71, temperature 100 F (37.8 C), resp. rate 20, height  (1.651 m), weight 104.1 kg (229 lb 8 oz), SpO2 94 %.  PHYSICAL EXAMINATION:  GENERAL:  67 y.o.-year-old patient lying in the bed with no acute distress.  EYES: Pupils equal, round, reactive to light and accommodation. No scleral icterus. Extraocular muscles intact.  HEENT: Head atraumatic, normocephalic. Oropharynx and nasopharynx clear.  NECK:  Supple, no jugular venous distention. No thyroid enlargement, no tenderness.  LUNGS: Normal breath sounds bilaterally, no wheezing, rales,rhonchi or crepitation. No use of accessory muscles of respiration.  CARDIOVASCULAR: S1, S2 normal. No murmurs, rubs, or gallops.  ABDOMEN: Soft, nontender, nondistended. Bowel sounds present. No organomegaly or mass.  EXTREMITIES: No pedal edema, cyanosis, or  clubbing.  NEUROLOGIC: Cranial nerves II through XII are intact. Muscle strength 5/5 in all extremities. Sensation intact. Gait not checked.  PSYCHIATRIC: The patient is alert and oriented x 3.  SKIN: No obvious rash, lesion, or ulcer.   Physical Exam LABORATORY PANEL:   CBC Recent Labs  Lab 07/04/17 0452  WBC 6.5  HGB 12.4*  HCT 35.6*  PLT 136*   ------------------------------------------------------------------------------------------------------------------  Chemistries  Recent Labs  Lab 07/03/17 2044 07/04/17 0452  NA 133* 136  K 4.1 3.7  CL 101 107  CO2 22 22  GLUCOSE 319* 196*  BUN 17 13  CREATININE 0.65 0.78  CALCIUM 8.6* 7.6*  AST 27  --   ALT 29  --   ALKPHOS 97  --   BILITOT 1.6*  --    ------------------------------------------------------------------------------------------------------------------  Cardiac Enzymes Recent Labs  Lab 07/03/17 2132  TROPONINI <0.03   ------------------------------------------------------------------------------------------------------------------  RADIOLOGY:  Dg Chest 2 View  Result Date: 07/03/2017 CLINICAL DATA:  Fever, shortness of breath, weakness EXAM: CHEST - 2 VIEW COMPARISON:  06/30/2015 FINDINGS: Right lower lung opacity, suspicious for right middle and lower lobe pneumonia. Mild left basilar atelectasis. No definite pleural effusions. No pneumothorax. The heart is top-normal in size. IMPRESSION: Right lower lung opacity, suspicious for right middle and lower lobe pneumonia. Electronically Signed   By: Charline Bills M.D.   On: 07/03/2017 20:55   Ct Chest W Contrast  Result Date: 07/05/2017 CLINICAL DATA:  Fever and chills, cough, generalized weakness and fatigue over the last 24 hours, worse recently EXAM: CT CHEST WITH CONTRAST TECHNIQUE: Multidetector CT imaging of the chest was performed during intravenous contrast administration. CONTRAST:  75mL OMNIPAQUE IOHEXOL  300 MG/ML  SOLN COMPARISON:  Chest x-ray  of 07/03/2017 FINDINGS: Cardiovascular: The heart is within normal limits in size. No pericardial effusion is seen. Coronary artery calcifications are noted primarily in the distribution of the right main coronary artery. No abnormality of the thoracic aorta is seen. The pulmonary arteries are not as well opacified but no central abnormality is seen. Mediastinum/Nodes: There are small mediastinal and hilar lymph nodes present none of which are pathologically enlarged. Thyroid gland is unremarkable. Lungs/Pleura: On lung window images, there is parenchymal opacity in the inferior right upper lobe, extensively throughout the right middle lobe and in the posterior right lower lobe and to a lesser degree posteromedial left lower lobe consistent with multifocal pneumonia. Small bilateral pleural effusions are present. Follow-up is recommended to ensure clearing. The central airway are patent. Upper Abdomen: The liver is low in attenuation consistent with hepatic steatosis. No calcified gallstones are seen. There appears to be a tiny amount of ascites perihepatic in location. Musculoskeletal: There are mild degenerative changes in the mid to lower thoracic spine. Alignment is normal. IMPRESSION: 1. Parenchymal opacities extensively throughout the right middle lobe, and to a lesser degree within the right lower lobe, left lower lobe, and inferior right upper lobe, consistent with multifocal pneumonia. Recommend follow-up to ensure clearing. 2. Small bilateral pleural effusions. 3. Low-attenuation of the liver may indicate hepatic steatosis. Correlate with LFTs. Electronically Signed   By: Dwyane Dee M.D.   On: 07/05/2017 09:19    ASSESSMENT AND PLAN:  *Acute sepsis Resolved Due to  multifocal CAP Continue sepsis protocol, IV fluids for rehydration  *Acute  multifocal CAP Resolving Continue pneumonia protocol, empiric Rocephin/azithromycin, follow-up on cultures, CT chest noted for multifocal pneumonia with  pleural effusion, ID consulted for persistent fevers-their input is greatly appreciated, respiratory therapy consulted for sputum induction, check urine for strep and Legionella  *Chronic diabetes mellitus type 2 Stable Continue sliding scale insulin with Accu-Cheks per routine  *Chronic benign essential hypertension Stable on current regiment   Disposition Home in 1 to 2 days barring any complications  All the records are reviewed and case discussed with Care Management/Social Workerr. Management plans discussed with the patient, family and they are in agreement.  CODE STATUS: full  TOTAL TIME TAKING CARE OF THIS PATIENT: 35 minutes.     POSSIBLE D/C IN 1-3 DAYS, DEPENDING ON CLINICAL CONDITION.   Evelena Asa Salary M.D on 07/05/2017   Between 7am to 6pm - Pager - [phone]  After 6pm go to [email or web address] - Social research officer, government  [facility] [place] [facility]  Office  [phone]  CC: Primary care physician; Center, [facility]  Note: This dictation was prepared with Nurse, children's dictation along with smaller phrase technology. Any transcriptional errors that result from this process are unintentional.

## 2017-07-06 LAB — LEGIONELLA PNEUMOPHILA SEROGP 1 UR AG: L. pneumophila Serogp 1 Ur Ag: NEGATIVE

## 2017-07-06 LAB — GLUCOSE, CAPILLARY
GLUCOSE-CAPILLARY: 267 mg/dL — AB (ref 65–99)
GLUCOSE-CAPILLARY: 222 mg/dL — AB (ref 65–99)
GLUCOSE-CAPILLARY: 227 mg/dL — AB (ref 65–99)
Glucose-Capillary: 187 mg/dL — ABNORMAL HIGH (ref 65–99)

## 2017-07-06 LAB — MRSA PCR SCREENING: MRSA by PCR: NEGATIVE

## 2017-07-06 LAB — PROCALCITONIN: PROCALCITONIN: 0.56 ng/mL

## 2017-07-06 LAB — EXPECTORATED SPUTUM ASSESSMENT W REFEX TO RESP CULTURE

## 2017-07-06 LAB — EXPECTORATED SPUTUM ASSESSMENT W GRAM STAIN, RFLX TO RESP C: Special Requests: NORMAL

## 2017-07-06 MED ORDER — SODIUM CHLORIDE 0.9 % IV SOLN
2.0000 g | Freq: Every day | INTRAVENOUS | Status: DC
  Administered 2017-07-06 – 2017-07-09 (×4): 2 g via INTRAVENOUS
  Filled 2017-07-06 (×4): qty 2
  Filled 2017-07-06: qty 20

## 2017-07-06 MED ORDER — IBUPROFEN 400 MG PO TABS
400.0000 mg | ORAL_TABLET | Freq: Four times a day (QID) | ORAL | Status: DC | PRN
  Administered 2017-07-06: 400 mg via ORAL
  Filled 2017-07-06: qty 1

## 2017-07-06 MED ORDER — LORATADINE 10 MG PO TABS: 10.0000 mg | ORAL_TABLET | Freq: Every day | ORAL | Status: DC | PRN

## 2017-07-06 MED ORDER — FLUTICASONE PROPIONATE 50 MCG/ACT NA SUSP
1.0000 | Freq: Every day | NASAL | Status: DC | PRN
  Administered 2017-07-09: 1 via NASAL
  Filled 2017-07-06: qty 16

## 2017-07-06 MED ORDER — IPRATROPIUM-ALBUTEROL 0.5-2.5 (3) MG/3ML IN SOLN
3.0000 mL | Freq: Three times a day (TID) | RESPIRATORY_TRACT | Status: DC
  Administered 2017-07-06 – 2017-07-07 (×2): 3 mL via RESPIRATORY_TRACT
  Filled 2017-07-06 (×2): qty 3

## 2017-07-06 NOTE — Progress Notes (Signed)
Assessment done. Family at bedside. Pt's son was able to interpret basic questions for this nurse. Pt indicated he has no pain, no diff breathing. Ambulated to br without diff or event. No apparent distress noted. o2 on 1l/min [place]. Pt encouraged to take in po liquids. Indicated to pt to use call bell for needs.

## 2017-07-06 NOTE — Progress Notes (Signed)
Tylenol administered for temp 101.2

## 2017-07-06 NOTE — Care Management Important Message (Signed)
Important Message  Patient Details  Name: Alvin [name] MRN: [id number] Date of Birth: 1950/03/21   Medicare Important Message Given:  Yes    [name] Sigal 07/06/2017, 11:24 AM

## 2017-07-06 NOTE — Consult Note (Signed)
[facility] Infectious Disease     Reason for Consult PNA, fever   Referring Physician: Loney Hering Date of Admission:  07/03/2017   Active Problems:   Sepsis [facility])   HPI: Evertte Sones is a 67 y.o. male  Admitted with fevers, chills, cough and weakness. On admit temp 103, sats 82%, wbc 6. CXR with multifocal PNA. CT confirms PNA. Started ceftriaxone and azithro. [place] neg. Has had recurrent fevers but BP seems stable . HIV negative.  Since admit he reports feeling better, less cough and fever.  He denies hx of prolonged illness, night sweats or significant wt loss. He has no known TB contacts or TB hx. Works in [employer] Lives with wife. No pets. Denies dysphagia.   Past Medical History:  Diagnosis Date  . Anxiety   . Diabetes mellitus without complication ([place])   . Hypertension    Past Surgical History:  Procedure Laterality Date  . COLON SURGERY  approx 2007   cyst removed   . COLONOSCOPY WITH PROPOFOL N/A 03/06/2016   Procedure: COLONOSCOPY WITH PROPOFOL;  Surgeon: Lucilla Lame, MD;  Location: [facility];  Service: Endoscopy;  Laterality: N/A;  . ESOPHAGOGASTRODUODENOSCOPY (EGD) WITH PROPOFOL N/A 08/19/2015   Procedure: ESOPHAGOGASTRODUODENOSCOPY (EGD) WITH PROPOFOL;  Surgeon: Lucilla Lame, MD;  Location: [facility];  Service: Endoscopy;  Laterality: N/A;  . ESOPHAGOGASTRODUODENOSCOPY (EGD) WITH PROPOFOL N/A 03/06/2016   Lucilla Lame, MD - PT NEEDS REPEAT IN 03/2017  . PANCREATIC CYST EXCISION     Social History   Tobacco Use  . Smoking status: Never Smoker  . Smokeless tobacco: Never Used  Substance Use Topics  . Alcohol use: Yes    Alcohol/week: 0.0 oz    Comment: occas  . Drug use: No   Family History  Problem Relation Age of Onset  . Alcohol abuse Neg Hx   . Arthritis Neg Hx   . Asthma Neg Hx   . Birth defects Neg Hx   . Cancer Neg Hx   . COPD Neg Hx   . Depression Neg Hx   . Diabetes Neg Hx   . Drug abuse Neg Hx   . Early death Neg  Hx   . Hearing loss Neg Hx   . Heart disease Neg Hx   . Hyperlipidemia Neg Hx   . Hypertension Neg Hx   . Kidney disease Neg Hx   . Learning disabilities Neg Hx   . Mental illness Neg Hx   . Mental retardation Neg Hx   . Stroke Neg Hx   . Miscarriages / Stillbirths Neg Hx   . Vision loss Neg Hx   . Varicose Veins Neg Hx     Allergies: No Known Allergies  Current antibiotics: Antibiotics Given (last 72 hours)    Date/Time Action Medication Dose Rate   07/03/17 2147 New Bag/Given   cefTRIAXone (ROCEPHIN) 2 g in sodium chloride 0.9 % 100 mL IVPB 2 g 200 mL/hr   07/03/17 2148 New Bag/Given   azithromycin (ZITHROMAX) 500 mg in sodium chloride 0.9 % 250 mL IVPB 500 mg 250 mL/hr   07/04/17 2212 New Bag/Given   cefTRIAXone (ROCEPHIN) 2 g in sodium chloride 0.9 % 100 mL IVPB 2 g 200 mL/hr   07/04/17 2212 New Bag/Given   azithromycin (ZITHROMAX) 500 mg in sodium chloride 0.9 % 250 mL IVPB 500 mg 250 mL/hr   07/05/17 1842 New Bag/Given   cefTRIAXone (ROCEPHIN) 1 g in sodium chloride 0.9 % 100 mL IVPB 1 g 200  mL/hr   07/05/17 2117 New Bag/Given   azithromycin (ZITHROMAX) 500 mg in sodium chloride 0.9 % 250 mL IVPB 500 mg 250 mL/hr      MEDICATIONS: . allopurinol  300 mg Oral Daily  . docusate sodium  100 mg Oral BID  . heparin  5,000 Units Subcutaneous Q8H  . insulin aspart  0-15 Units Subcutaneous TID WC  . insulin aspart  0-5 Units Subcutaneous QHS  . insulin aspart  3 Units Subcutaneous TID WC  . insulin glargine  10 Units Subcutaneous QHS  . ipratropium-albuterol  3 mL Nebulization Q6H  . levothyroxine  88 mcg Oral QAC breakfast  . pantoprazole  40 mg Oral QAC breakfast  . quinapril  20 mg Oral Daily  . tamsulosin  0.4 mg Oral Daily    Review of Systems - 11 systems reviewed and negative per HPI   OBJECTIVE: Temp:  [98.3 F (36.8 C)-103 F (39.4 C)] 98.3 F (36.8 C) (05/31 1152) Pulse Rate:  [68-87] 70 (05/31 1152) Resp:  [18] 18 (05/31 0741) BP: (113-164)/(60-79)  127/70 (05/31 1152) SpO2:  [92 %-94 %] 94 % (05/31 1152) Weight:  [99.8 kg (220 lb 1.6 oz)] 99.8 kg (220 lb 1.6 oz) (05/31 0527) Physical Exam  Physical Exam  Constitutional: He is oriented to person, place, and time. He appears well-developed and well-nourished. No distress. Obese. On 2L O2 HENT: anicteric Mouth/Throat: Oropharynx is clear and moist. No oropharyngeal exudate.  Cardiovascular: Normal rate, regular rhythm and normal heart sounds. Exam reveals no gallop and no friction rub.  No murmur heard.  Pulmonary/Chest: poor air movement, Bil Rh[place] R > L Abdominal: Soft. Bowel sounds are normal. He exhibits no distension. There is no tenderness.  Lymphadenopathy:  He has no cervical adenopathy.  Neurological: He is alert and oriented to person, place, and time.  Skin: Skin is warm and dry. No rash noted. No erythema.  Psychiatric: He has a normal mood and affect. His behavior is normal.       LABS: Results for orders placed or performed during the hospital encounter of 07/03/17 (from the past 48 hour(s))  Glucose, capillary     Status: Abnormal   Collection Time: 07/04/17  4:47 PM  Result Value Ref Range   Glucose-Capillary 228 (H) 65 - 99 mg/dL  Glucose, capillary     Status: Abnormal   Collection Time: 07/04/17 10:30 PM  Result Value Ref Range   Glucose-Capillary 285 (H) 65 - 99 mg/dL  Glucose, capillary     Status: Abnormal   Collection Time: 07/05/17  7:53 AM  Result Value Ref Range   Glucose-Capillary 190 (H) 65 - 99 mg/dL   Comment 1 Notify RN   Procalcitonin - Baseline     Status: None   Collection Time: 07/05/17 10:27 AM  Result Value Ref Range   Procalcitonin 0.39 ng/mL    Comment:        Interpretation: PCT (Procalcitonin) <= 0.5 ng/mL: Systemic infection (sepsis) is not likely. Local bacterial infection is possible. (NOTE)       Sepsis PCT Algorithm           Lower Respiratory Tract                                      Infection PCT Algorithm     ----------------------------     ----------------------------         PCT <  0.25 ng/mL                PCT < 0.10 ng/mL         Strongly encourage             Strongly discourage   discontinuation of antibiotics    initiation of antibiotics    ----------------------------     -----------------------------       PCT 0.25 - 0.50 ng/mL            PCT 0.10 - 0.25 ng/mL               OR       >80% decrease in PCT            Discourage initiation of                                            antibiotics      Encourage discontinuation           of antibiotics    ----------------------------     -----------------------------         PCT >= 0.50 ng/mL              PCT 0.26 - 0.50 ng/mL               AND        <80% decrease in PCT             Encourage initiation of                                             antibiotics       Encourage continuation           of antibiotics    ----------------------------     -----------------------------        PCT >= 0.50 ng/mL                  PCT > 0.50 ng/mL               AND         increase in PCT                  Strongly encourage                                      initiation of antibiotics    Strongly encourage escalation           of antibiotics                                     -----------------------------                                           PCT <= 0.25 ng/mL  OR                                        > 80% decrease in PCT                                     Discontinue / Do not initiate                                             antibiotics Performed at [facility] [place], [place] [address]   Glucose, capillary     Status: Abnormal   Collection Time: 07/05/17 12:33 PM  Result Value Ref Range   Glucose-Capillary 286 (H) 65 - 99 mg/dL  Strep pneumoniae urinary antigen     Status: None   Collection Time: 07/05/17  3:30 PM  Result Value Ref Range   Strep  Pneumo Urinary Antigen NEGATIVE NEGATIVE    Comment:        Infection due to S. pneumoniae cannot be absolutely ruled out since the antigen present may be below the detection limit of the test. Performed at [facility], [place] [address] [place], [place] [address]   Glucose, capillary     Status: Abnormal   Collection Time: 07/05/17  5:18 PM  Result Value Ref Range   Glucose-Capillary 201 (H) 65 - 99 mg/dL  Glucose, capillary     Status: Abnormal   Collection Time: 07/05/17  8:59 PM  Result Value Ref Range   Glucose-Capillary 230 (H) 65 - 99 mg/dL  Glucose, capillary     Status: Abnormal   Collection Time: 07/05/17 10:57 PM  Result Value Ref Range   Glucose-Capillary 241 (H) 65 - 99 mg/dL  Procalcitonin - Baseline     Status: None   Collection Time: 07/06/17  4:37 AM  Result Value Ref Range   Procalcitonin 0.56 ng/mL    Comment:        Interpretation: PCT > 0.5 ng/mL and <= 2 ng/mL: Systemic infection (sepsis) is possible, but other conditions are known to elevate PCT as well. (NOTE)       Sepsis PCT Algorithm           Lower Respiratory Tract                                      Infection PCT Algorithm    ----------------------------     ----------------------------         PCT < 0.25 ng/mL                PCT < 0.10 ng/mL         Strongly encourage             Strongly discourage   discontinuation of antibiotics    initiation of antibiotics    ----------------------------     -----------------------------       PCT 0.25 - 0.50 ng/mL            PCT 0.10 - 0.25 ng/mL               OR       >  80% decrease in PCT            Discourage initiation of                                            antibiotics      Encourage discontinuation           of antibiotics    ----------------------------     -----------------------------         PCT >= 0.50 ng/mL              PCT 0.26 - 0.50 ng/mL                AND       <80% decrease in PCT             Encourage initiation of                                              antibiotics       Encourage continuation           of antibiotics    ----------------------------     -----------------------------        PCT >= 0.50 ng/mL                  PCT > 0.50 ng/mL               AND         increase in PCT                  Strongly encourage                                      initiation of antibiotics    Strongly encourage escalation           of antibiotics                                     -----------------------------                                           PCT <= 0.25 ng/mL                                                 OR                                        > 80% decrease in PCT                                     Discontinue / Do not initiate  antibiotics Performed at [facility], [address] [place], [place] [address]   Glucose, capillary     Status: Abnormal   Collection Time: 07/06/17  7:42 AM  Result Value Ref Range   Glucose-Capillary 187 (H) 65 - 99 mg/dL   Comment 1 Notify RN   Glucose, capillary     Status: Abnormal   Collection Time: 07/06/17 11:53 AM  Result Value Ref Range   Glucose-Capillary 267 (H) 65 - 99 mg/dL   Comment 1 Notify RN    No components found for: ESR, C REACTIVE PROTEIN MICRO: Recent Results (from the past 720 hour(s))  Group A Strep by PCR     Status: None   Collection Time: 07/02/17  9:22 AM  Result Value Ref Range Status   Group A Strep by PCR NOT DETECTED NOT DETECTED Final    Comment: Performed at [facility], [address] [place], [place] [address]  Blood Culture (routine x 2)     Status: None (Preliminary result)   Collection Time: 07/03/17  9:32 PM  Result Value Ref Range Status   Specimen Description BLOOD BLOOD LEFT WRIST  Final   Special Requests   Final    BOTTLES DRAWN AEROBIC AND ANAEROBIC Blood Culture adequate volume   Culture   Final    NO GROWTH 3 DAYS Performed at [facility], [address] [place], [place] [address]    Report Status PENDING  Incomplete  Blood Culture (routine x 2)     Status: None (Preliminary result)   Collection Time: 07/03/17  9:34 PM  Result Value Ref Range Status   Specimen Description BLOOD Blood Culture adequate volume  Final   Special Requests   Final    BOTTLES DRAWN AEROBIC AND ANAEROBIC BLOOD RIGHT FOREARM   Culture   Final    NO GROWTH 3 DAYS Performed at [facility], [address], [place], [place] [address]    Report Status PENDING  Incomplete    IMAGING: Dg Chest 2 View  Result Date: 07/03/2017 CLINICAL DATA:  Fever, shortness of breath, weakness EXAM: CHEST - 2 VIEW COMPARISON:  06/30/2015 FINDINGS: Right lower lung opacity, suspicious for right middle and lower lobe pneumonia. Mild left basilar atelectasis. No definite pleural effusions. No pneumothorax. The heart is top-normal in size. IMPRESSION: Right lower lung opacity, suspicious for right middle and lower lobe pneumonia. Electronically Signed   By: Julian Hy M.D.   On: 07/03/2017 20:55   Ct Chest W Contrast  Result Date: 07/05/2017 CLINICAL DATA:  Fever and chills, cough, generalized weakness and fatigue over the last 24 hours, worse recently EXAM: CT CHEST WITH CONTRAST TECHNIQUE: Multidetector CT imaging of the chest was performed during intravenous contrast administration. CONTRAST:  18m OMNIPAQUE IOHEXOL 300 MG/ML  SOLN COMPARISON:  Chest x-ray of 07/03/2017 FINDINGS: Cardiovascular: The heart is within normal limits in size. No pericardial effusion is seen. Coronary artery calcifications are noted primarily in the distribution of the right main coronary artery. No abnormality of the thoracic aorta is seen. The pulmonary arteries are not as well opacified but no central abnormality is seen. Mediastinum/Nodes: There are small mediastinal and hilar lymph nodes present none of which are pathologically enlarged. Thyroid gland is  unremarkable. Lungs/Pleura: On lung window images, there is parenchymal opacity in the inferior right upper lobe, extensively throughout the right middle lobe and in the posterior right lower lobe and to a lesser degree posteromedial left lower lobe consistent with multifocal  pneumonia. Small bilateral pleural effusions are present. Follow-up is recommended to ensure clearing. The central airway are patent. Upper Abdomen: The liver is low in attenuation consistent with hepatic steatosis. No calcified gallstones are seen. There appears to be a tiny amount of ascites perihepatic in location. Musculoskeletal: There are mild degenerative changes in the mid to lower thoracic spine. Alignment is normal. IMPRESSION: 1. Parenchymal opacities extensively throughout the right middle lobe, and to a lesser degree within the right lower lobe, left lower lobe, and inferior right upper lobe, consistent with multifocal pneumonia. Recommend follow-up to ensure clearing. 2. Small bilateral pleural effusions. 3. Low-attenuation of the liver may indicate hepatic steatosis. Correlate with LFTs. Electronically Signed   By: Ivar Drape M.D.   On: 07/05/2017 09:19    Assessment:   Cael Worth is a 67 y.o. male with hx DM, admitted with acute cough, fevers x 3-4 days. On admit his wbc was nml but febrile and CT with multifocal infiltrate. BCX neg, HIV neg, on ctx and azithro with some impovement but still intermittent fevers. Strep PNA uag neg He denies any TB risk factors or exposures. Denies aspiration.  Recommendations Cont ctx. Has finished 3 days of azithro 500 mg.  Check MRSA PCR U leg pending CX ordered If still febrile tomorrow would change to unasyn from ceftriaxone and repeat CXR. If progresses consider bronchoscoy Thank you very much for allowing me to participate in the care of this patient. Please call with questions.   Cheral Marker. Ola Spurr, MD

## 2017-07-06 NOTE — Progress Notes (Signed)
[facility] [place] [facility]   PATIENT NAME: Alvin [name]    MR#:  [id number]  DATE OF BIRTH:  [birth year]  SUBJECTIVE:  CHIEF COMPLAINT:   Chief Complaint  Patient presents with  . Fever  . Weakness  . Shortness of Breath  Patient feeling better, multiple family members at the bedside, continued fevers overnight but fever curve is improving  REVIEW OF SYSTEMS:  CONSTITUTIONAL: No fever, fatigue or weakness.  EYES: No blurred or double vision.  EARS, NOSE, AND THROAT: No tinnitus or ear pain.  RESPIRATORY: No cough, shortness of breath, wheezing or hemoptysis.  CARDIOVASCULAR: No chest pain, orthopnea, edema.  GASTROINTESTINAL: No nausea, vomiting, diarrhea or abdominal pain.  GENITOURINARY: No dysuria, hematuria.  ENDOCRINE: No polyuria, nocturia,  HEMATOLOGY: No anemia, easy bruising or bleeding SKIN: No rash or lesion. MUSCULOSKELETAL: No joint pain or arthritis.   NEUROLOGIC: No tingling, numbness, weakness.  PSYCHIATRY: No anxiety or depression.   ROS  DRUG ALLERGIES:  No Known Allergies  VITALS:  Blood pressure 113/60, pulse 68, temperature 98.3 F (36.8 C), resp. rate 18, height  (1.651 m), weight 99.8 kg (220 lb 1.6 oz), SpO2 92 %.  PHYSICAL EXAMINATION:  GENERAL:  67 y.o.-year-old patient lying in the bed with no acute distress.  EYES: Pupils equal, round, reactive to light and accommodation. No scleral icterus. Extraocular muscles intact.  HEENT: Head atraumatic, normocephalic. Oropharynx and nasopharynx clear.  NECK:  Supple, no jugular venous distention. No thyroid enlargement, no tenderness.  LUNGS: Normal breath sounds bilaterally, no wheezing, rales,rhonchi or crepitation. No use of accessory muscles of respiration.  CARDIOVASCULAR: S1, S2 normal. No murmurs, rubs, or gallops.  ABDOMEN: Soft, nontender, nondistended. Bowel sounds present. No organomegaly or mass.  EXTREMITIES: No pedal edema, cyanosis, or clubbing.   NEUROLOGIC: Cranial nerves II through XII are intact. Muscle strength 5/5 in all extremities. Sensation intact. Gait not checked.  PSYCHIATRIC: The patient is alert and oriented x 3.  SKIN: No obvious rash, lesion, or ulcer.   Physical Exam LABORATORY PANEL:   CBC Recent Labs  Lab 07/04/17 0452  WBC 6.5  HGB 12.4*  HCT 35.6*  PLT 136*   ------------------------------------------------------------------------------------------------------------------  Chemistries  Recent Labs  Lab 07/03/17 2044 07/04/17 0452  NA 133* 136  K 4.1 3.7  CL 101 107  CO2 22 22  GLUCOSE 319* 196*  BUN 17 13  CREATININE 0.65 0.78  CALCIUM 8.6* 7.6*  AST 27  --   ALT 29  --   ALKPHOS 97  --   BILITOT 1.6*  --    ------------------------------------------------------------------------------------------------------------------  Cardiac Enzymes Recent Labs  Lab 07/03/17 2132  TROPONINI <0.03   ------------------------------------------------------------------------------------------------------------------  RADIOLOGY:  Ct Chest W Contrast  Result Date: 07/05/2017 CLINICAL DATA:  Fever and chills, cough, generalized weakness and fatigue over the last 24 hours, worse recently EXAM: CT CHEST WITH CONTRAST TECHNIQUE: Multidetector CT imaging of the chest was performed during intravenous contrast administration. CONTRAST:  75mL OMNIPAQUE IOHEXOL 300 MG/ML  SOLN COMPARISON:  Chest x-ray of 07/03/2017 FINDINGS: Cardiovascular: The heart is within normal limits in size. No pericardial effusion is seen. Coronary artery calcifications are noted primarily in the distribution of the right main coronary artery. No abnormality of the thoracic aorta is seen. The pulmonary arteries are not as well opacified but no central abnormality is seen. Mediastinum/Nodes: There are small mediastinal and hilar lymph nodes present none of which are pathologically enlarged. Thyroid gland is unremarkable. Lungs/Pleura: On  lung  window images, there is parenchymal opacity in the inferior right upper lobe, extensively throughout the right middle lobe and in the posterior right lower lobe and to a lesser degree posteromedial left lower lobe consistent with multifocal pneumonia. Small bilateral pleural effusions are present. Follow-up is recommended to ensure clearing. The central airway are patent. Upper Abdomen: The liver is low in attenuation consistent with hepatic steatosis. No calcified gallstones are seen. There appears to be a tiny amount of ascites perihepatic in location. Musculoskeletal: There are mild degenerative changes in the mid to lower thoracic spine. Alignment is normal. IMPRESSION: 1. Parenchymal opacities extensively throughout the right middle lobe, and to a lesser degree within the right lower lobe, left lower lobe, and inferior right upper lobe, consistent with multifocal pneumonia. Recommend follow-up to ensure clearing. 2. Small bilateral pleural effusions. 3. Low-attenuation of the liver may indicate hepatic steatosis. Correlate with LFTs. Electronically Signed   By: Dwyane Dee M.D.   On: 07/05/2017 09:19    ASSESSMENT AND PLAN:  *Acute extensive right-sided multifocal CAP Resolving slowly Continue pneumonia protocol, empiric Rocephin/azithromycin, cultures negative thus far-follow-up on outstanding cultures, CT chest noted, ID/Dr. Sampson Goon input appreciated, RT following   *Acute sepsis Resolved Due to multifocal CAP Treated on our sepsis protocol   *Chronic diabetes mellitus type 2 Controlled on current regimen  *Chronic benign essential hypertension Stable on current regiment   Disposition Home in 1-3 days barring any complications  All the records are reviewed and case discussed with Care Management/Social Workerr. Management plans discussed with the patient, family and they are in agreement.  CODE STATUS: full  TOTAL TIME TAKING CARE OF THIS PATIENT: 35 minutes.      POSSIBLE D/C IN 35 DAYS, DEPENDING ON CLINICAL CONDITION.   Evelena Asa Max Nuno M.D on 07/06/2017   Between 7am to 6pm - Pager - [phone]  After 6pm go to [email or web address] - Social research officer, government  [facility]  Office  [phone]  CC: Primary care physician; Center, [facility]  Note: This dictation was prepared with Nurse, children's dictation along with smaller phrase technology. Any transcriptional errors that result from this process are unintentional.

## 2017-07-07 LAB — CBC
HCT: 36.4 % — ABNORMAL LOW (ref 40.0–52.0)
Hemoglobin: 12.9 g/dL — ABNORMAL LOW (ref 13.0–18.0)
MCH: 31.6 pg (ref 26.0–34.0)
MCHC: 35.3 g/dL (ref 32.0–36.0)
MCV: 89.4 fL (ref 80.0–100.0)
PLATELETS: 181 10*3/uL (ref 150–440)
RBC: 4.08 MIL/uL — AB (ref 4.40–5.90)
RDW: 13 % (ref 11.5–14.5)
WBC: 3.8 10*3/uL (ref 3.8–10.6)

## 2017-07-07 LAB — GLUCOSE, CAPILLARY
GLUCOSE-CAPILLARY: 230 mg/dL — AB (ref 65–99)
Glucose-Capillary: 162 mg/dL — ABNORMAL HIGH (ref 65–99)
Glucose-Capillary: 246 mg/dL — ABNORMAL HIGH (ref 65–99)
Glucose-Capillary: 217 mg/dL — ABNORMAL HIGH (ref 65–99)

## 2017-07-07 MED ORDER — IPRATROPIUM-ALBUTEROL 0.5-2.5 (3) MG/3ML IN SOLN
3.0000 mL | Freq: Two times a day (BID) | RESPIRATORY_TRACT | Status: DC
  Administered 2017-07-07 – 2017-07-10 (×6): 3 mL via RESPIRATORY_TRACT
  Filled 2017-07-07 (×6): qty 3

## 2017-07-07 MED ORDER — GUAIFENESIN ER 600 MG PO TB12
600.0000 mg | ORAL_TABLET | Freq: Two times a day (BID) | ORAL | Status: DC
  Administered 2017-07-07 – 2017-07-10 (×7): 600 mg via ORAL
  Filled 2017-07-07 (×7): qty 1

## 2017-07-07 NOTE — Progress Notes (Signed)
Pt awake in bed. No c/o offered. Family members x2 remain at bedside. Call bell in reach.

## 2017-07-07 NOTE — Progress Notes (Signed)
[employer] [facility] - [place] at [facility]   PATIENT NAME: Alvin [name]    MR#:  [id number]  DATE OF BIRTH:  1950-08-23  SUBJECTIVE:  CHIEF COMPLAINT:   Chief Complaint  Patient presents with  . Fever  . Weakness  . Shortness of Breath    pt feeling better,  Sob improved  REVIEW OF SYSTEMS:  CONSTITUTIONAL: No fever, fatigue or weakness.  EYES: No blurred or double vision.  EARS, NOSE, AND THROAT: No tinnitus or ear pain.  RESPIRATORY: + cough,+  shortness of breath, wheezing or hemoptysis.  CARDIOVASCULAR: No chest pain, orthopnea, edema.  GASTROINTESTINAL: No nausea, vomiting, diarrhea or abdominal pain.  GENITOURINARY: No dysuria, hematuria.  ENDOCRINE: No polyuria, nocturia,  HEMATOLOGY: No anemia, easy bruising or bleeding SKIN: No rash or lesion. MUSCULOSKELETAL: No joint pain or arthritis.   NEUROLOGIC: No tingling, numbness, weakness.  PSYCHIATRY: No anxiety or depression.   ROS  DRUG ALLERGIES:  No Known Allergies  VITALS:  Blood pressure 133/84, pulse 66, temperature 98.4 F (36.9 C), temperature source Oral, resp. rate 18, height 5\' 5"  (1.651 m), weight 98.7 kg (217 lb 9.5 oz), SpO2 93 %.  PHYSICAL EXAMINATION:  GENERAL:  67 y.o.-year-old patient lying in the bed with no acute distress.  EYES: Pupils equal, round, reactive to light and accommodation. No scleral icterus. Extraocular muscles intact.  HEENT: Head atraumatic, normocephalic. Oropharynx and nasopharynx clear.  NECK:  Supple, no jugular venous distention. No thyroid enlargement, no tenderness.  LUNGS: Normal breath sounds bilaterally, no wheezing, rales,rhonchi or crepitation. No use of accessory muscles of respiration.  CARDIOVASCULAR: S1, S2 normal. No murmurs, rubs, or gallops.  ABDOMEN: Soft, nontender, nondistended. Bowel sounds present. No organomegaly or mass.  EXTREMITIES: No pedal edema, cyanosis, or clubbing.  NEUROLOGIC: Cranial nerves II through XII are intact. Muscle  strength 5/5 in all extremities. Sensation intact. Gait not checked.  PSYCHIATRIC: The patient is alert and oriented x 3.  SKIN: No obvious rash, lesion, or ulcer.   Physical Exam LABORATORY PANEL:   CBC Recent Labs  Lab 07/07/17 0648  WBC 3.8  HGB 12.9*  HCT 36.4*  PLT 181   ------------------------------------------------------------------------------------------------------------------  Chemistries  Recent Labs  Lab 07/03/17 2044 07/04/17 0452  NA 133* 136  K 4.1 3.7  CL 101 107  CO2 22 22  GLUCOSE 319* 196*  BUN 17 13  CREATININE 0.65 0.78  CALCIUM 8.6* 7.6*  AST 27  --   ALT 29  --   ALKPHOS 97  --   BILITOT 1.6*  --    ------------------------------------------------------------------------------------------------------------------  Cardiac Enzymes Recent Labs  Lab 07/03/17 2132  TROPONINI <0.03   ------------------------------------------------------------------------------------------------------------------  RADIOLOGY:  No results found.  ASSESSMENT AND PLAN:  *Acute extensive right-sided multifocal CAP Resolving slowly Continue pneumonia protocol, empiric Rocephin/azithromycin, cultures negative thus far-follow-up on outstanding cultures, CT chest noted, ID/Dr. Sampson GoonFitzgerald input appreciated,  I will add Mucinex to current regimen Follow-up on procalcitonin level   *Acute sepsis Resolved Due to multifocal CAP   *Chronic diabetes mellitus type 2 Controlled on current regimen  *Chronic benign essential hypertension Stable on current regiment    All the records are reviewed and case discussed with Care Management/Social Workerr. Management plans discussed with the patient, family and they are in agreement.  CODE STATUS: full  TOTAL TIME TAKING CARE OF THIS PATIENT: 35 minutes.     POSSIBLE D/C IN 35 DAYS, DEPENDING ON CLINICAL CONDITION.   Auburn BilberryShreyang Cissy Galbreath M.D on 07/07/2017   Between 7am  to 6pm - Pager - [phone]  After  6pm go to [email or web address] - Social research officer, government  [facility] [place] [facility]  Office  [phone]  CC: Primary care physician; Center, [facility]  Note: This dictation was prepared with Nurse, children's dictation along with smaller phrase technology. Any transcriptional errors that result from this process are unintentional.

## 2017-07-07 NOTE — Progress Notes (Signed)
Uneventful night. resp easy. Slept in intervals. o2 on at 1l/min [place] through the night. Call bell in reach.

## 2017-07-07 NOTE — Progress Notes (Signed)
FBS 217

## 2017-07-08 ENCOUNTER — Inpatient Hospital Stay: Payer: Medicare Other

## 2017-07-08 LAB — CBC
HCT: 37 % — ABNORMAL LOW (ref 40.0–52.0)
Hemoglobin: 13.3 g/dL (ref 13.0–18.0)
MCH: 31.8 pg (ref 26.0–34.0)
MCHC: 35.8 g/dL (ref 32.0–36.0)
MCV: 88.6 fL (ref 80.0–100.0)
PLATELETS: 190 10*3/uL (ref 150–440)
RBC: 4.18 MIL/uL — AB (ref 4.40–5.90)
RDW: 12.6 % (ref 11.5–14.5)
WBC: 3.1 10*3/uL — AB (ref 3.8–10.6)

## 2017-07-08 LAB — BASIC METABOLIC PANEL
ANION GAP: 8 (ref 5–15)
BUN: 15 mg/dL (ref 6–20)
CO2: 21 mmol/L — ABNORMAL LOW (ref 22–32)
Calcium: 8.1 mg/dL — ABNORMAL LOW (ref 8.9–10.3)
Chloride: 109 mmol/L (ref 101–111)
Creatinine, Ser: 0.72 mg/dL (ref 0.61–1.24)
GFR calc Af Amer: >60 mL/min (ref >60)
GLUCOSE: 268 mg/dL — AB (ref 65–99)
POTASSIUM: 3.7 mmol/L (ref 3.5–5.1)
Sodium: 138 mmol/L (ref 135–145)

## 2017-07-08 LAB — CULTURE, BLOOD (ROUTINE X 2)
CULTURE: NO GROWTH
CULTURE: NO GROWTH
SPECIAL REQUESTS: ADEQUATE
Specimen Description: ADEQUATE

## 2017-07-08 LAB — GLUCOSE, CAPILLARY
GLUCOSE-CAPILLARY: 279 mg/dL — AB (ref 65–99)
Glucose-Capillary: 231 mg/dL — ABNORMAL HIGH (ref 65–99)
Glucose-Capillary: 205 mg/dL — ABNORMAL HIGH (ref 65–99)
Glucose-Capillary: 120 mg/dL — ABNORMAL HIGH (ref 65–99)

## 2017-07-08 LAB — PROCALCITONIN: PROCALCITONIN: 0.19 ng/mL

## 2017-07-08 MED ORDER — GLIPIZIDE 5 MG PO TABS
5.0000 mg | ORAL_TABLET | Freq: Two times a day (BID) | ORAL | Status: DC
  Administered 2017-07-08 – 2017-07-10 (×4): 5 mg via ORAL
  Filled 2017-07-08 (×6): qty 1

## 2017-07-08 MED ORDER — METFORMIN HCL 500 MG PO TABS
500.0000 mg | ORAL_TABLET | Freq: Two times a day (BID) | ORAL | Status: DC
  Administered 2017-07-08 – 2017-07-10 (×4): 500 mg via ORAL
  Filled 2017-07-08 (×5): qty 1

## 2017-07-08 MED ORDER — ACETYLCYSTEINE 20 % IN SOLN
4.0000 mL | Freq: Two times a day (BID) | RESPIRATORY_TRACT | Status: DC
  Administered 2017-07-08 – 2017-07-10 (×4): 4 mL via RESPIRATORY_TRACT
  Filled 2017-07-08 (×7): qty 4

## 2017-07-08 NOTE — Progress Notes (Signed)
[employer] [facility] - [place] at [facility]   PATIENT NAME: Alvin [name]    MR#:  [id number]  DATE OF BIRTH:  15-May-1950  SUBJECTIVE:  CHIEF COMPLAINT:   Chief Complaint  Patient presents with  . Fever  . Weakness  . Shortness of Breath   Continues to have cough and congestion  REVIEW OF SYSTEMS:  CONSTITUTIONAL: No fever, fatigue or weakness.  EYES: No blurred or double vision.  EARS, NOSE, AND THROAT: No tinnitus or ear pain.  RESPIRATORY: + cough,+  shortness of breath, wheezing or hemoptysis.  CARDIOVASCULAR: No chest pain, orthopnea, edema.  GASTROINTESTINAL: No nausea, vomiting, diarrhea or abdominal pain.  GENITOURINARY: No dysuria, hematuria.  ENDOCRINE: No polyuria, nocturia,  HEMATOLOGY: No anemia, easy bruising or bleeding SKIN: No rash or lesion. MUSCULOSKELETAL: No joint pain or arthritis.   NEUROLOGIC: No tingling, numbness, weakness.  PSYCHIATRY: No anxiety or depression.   ROS  DRUG ALLERGIES:  No Known Allergies  VITALS:  Blood pressure 125/70, pulse 65, temperature 98.6 F (37 C), temperature source Oral, resp. rate 18, height 5\' 5"  (1.651 m), weight 98.8 kg (217 lb 12.8 oz), SpO2 91 %.  PHYSICAL EXAMINATION:  GENERAL:  67 y.o.-year-old patient lying in the bed with no acute distress.  EYES: Pupils equal, round, reactive to light and accommodation. No scleral icterus. Extraocular muscles intact.  HEENT: Head atraumatic, normocephalic. Oropharynx and nasopharynx clear.  NECK:  Supple, no jugular venous distention. No thyroid enlargement, no tenderness.  LUNGS: Normal breath sounds bilaterally, no wheezing, rales,rhonchi or crepitation. No use of accessory muscles of respiration.  CARDIOVASCULAR: S1, S2 normal. No murmurs, rubs, or gallops.  ABDOMEN: Soft, nontender, nondistended. Bowel sounds present. No organomegaly or mass.  EXTREMITIES: No pedal edema, cyanosis, or clubbing.  NEUROLOGIC: Cranial nerves II through XII are intact. Muscle  strength 5/5 in all extremities. Sensation intact. Gait not checked.  PSYCHIATRIC: The patient is alert and oriented x 3.  SKIN: No obvious rash, lesion, or ulcer.   Physical Exam LABORATORY PANEL:   CBC Recent Labs  Lab 07/08/17 0521  WBC 3.1*  HGB 13.3  HCT 37.0*  PLT 190   ------------------------------------------------------------------------------------------------------------------  Chemistries  Recent Labs  Lab 07/03/17 2044  07/08/17 0521  NA 133*   < > 138  K 4.1   < > 3.7  CL 101   < > 109  CO2 22   < > 21*  GLUCOSE 319*   < > 268*  BUN 17   < > 15  CREATININE 0.65   < > 0.72  CALCIUM 8.6*   < > 8.1*  AST 27  --   --   ALT 29  --   --   ALKPHOS 97  --   --   BILITOT 1.6*  --   --    < > = values in this interval not displayed.   ------------------------------------------------------------------------------------------------------------------  Cardiac Enzymes Recent Labs  Lab 07/03/17 2132  TROPONINI <0.03   ------------------------------------------------------------------------------------------------------------------  RADIOLOGY:  Dg Chest 2 View  Result Date: 07/08/2017 CLINICAL DATA:  Shortness of breath.  Pneumonia. EXAM: CHEST - 2 VIEW COMPARISON:  Jul 03, 2017 FINDINGS: Infiltrates in the lingula and right middle lobe persist but have improved. Tiny effusions. IMPRESSION: Persistent but improving lingular and right middle lobe infiltrates. Tiny effusions. Electronically Signed   By: Gerome Sam III M.D   On: 07/08/2017 08:16    ASSESSMENT AND PLAN:  *Acute extensive right-sided multifocal CAP Resolving slowly Follow-up procalcitonin level  today Chest x-ray showed improvement yesterday  cultures, CT chest noted, ID/Dr. Sampson GoonFitzgerald input appreciated,  Continue Mucinex to current regimen I will add Mucomyst to the current regimen   *Acute sepsis Resolved Due to multifocal CAP   *Chronic diabetes mellitus type 2 blood sugars  elevated Resume home metformin and glipizide  *Chronic benign essential hypertension Stable on current regiment    All the records are reviewed and case discussed with Care Management/Social Workerr. Management plans discussed with the patient, family and they are in agreement.  CODE STATUS: full  TOTAL TIME TAKING CARE OF THIS PATIENT: 35 minutes.     POSSIBLE D/C IN 35 DAYS, DEPENDING ON CLINICAL CONDITION.   Auburn BilberryShreyang Junah Yam M.D on 07/08/2017   Between 7am to 6pm - Pager - [phone]  After 6pm go to [email or web address] - Social research officer, governmentpassword [email or web address] [email or web address]  [facility] [place] [facility]  Office  [phone]  CC: Primary care physician; Center, [facility]  Note: This dictation was prepared with Nurse, children'sDragon dictation along with smaller phrase technology. Any transcriptional errors that result from this process are unintentional.

## 2017-07-09 LAB — BASIC METABOLIC PANEL
Anion gap: 9 (ref 5–15)
BUN: 14 mg/dL (ref 6–20)
CHLORIDE: 108 mmol/L (ref 101–111)
CO2: 22 mmol/L (ref 22–32)
Calcium: 8.5 mg/dL — ABNORMAL LOW (ref 8.9–10.3)
Creatinine, Ser: 0.71 mg/dL (ref 0.61–1.24)
GFR calc Af Amer: >60 mL/min (ref >60)
GFR calc non Af Amer: >60 mL/min (ref >60)
Glucose, Bld: 145 mg/dL — ABNORMAL HIGH (ref 65–99)
POTASSIUM: 3.5 mmol/L (ref 3.5–5.1)
SODIUM: 139 mmol/L (ref 135–145)

## 2017-07-09 LAB — GLUCOSE, CAPILLARY
GLUCOSE-CAPILLARY: 152 mg/dL — AB (ref 65–99)
GLUCOSE-CAPILLARY: 190 mg/dL — AB (ref 65–99)
GLUCOSE-CAPILLARY: 151 mg/dL — AB (ref 65–99)
GLUCOSE-CAPILLARY: 228 mg/dL — AB (ref 65–99)

## 2017-07-09 LAB — CULTURE, RESPIRATORY W GRAM STAIN: Special Requests: NORMAL

## 2017-07-09 LAB — CBC
HEMATOCRIT: 37.1 % — AB (ref 40.0–52.0)
HEMOGLOBIN: 12.9 g/dL — AB (ref 13.0–18.0)
MCH: 31.4 pg (ref 26.0–34.0)
MCHC: 34.9 g/dL (ref 32.0–36.0)
MCV: 89.9 fL (ref 80.0–100.0)
Platelets: 244 10*3/uL (ref 150–440)
RBC: 4.13 MIL/uL — AB (ref 4.40–5.90)
RDW: 12.9 % (ref 11.5–14.5)
WBC: 4.6 10*3/uL (ref 3.8–10.6)

## 2017-07-09 LAB — CULTURE, RESPIRATORY: CULTURE: NORMAL

## 2017-07-09 NOTE — Care Management Important Message (Signed)
Important Message  Patient Details  Name: Alvin [name] MRN: [id number] Date of Birth: December 31, 1950   Medicare Important Message Given:  Yes    Olegario MessierKathy A Rokia Bosket 07/09/2017, 10:20 AM

## 2017-07-09 NOTE — Progress Notes (Signed)
[employer] [facility] - [place] at [facility]   PATIENT NAME: Alvin [name]    MR#:  [id number]  DATE OF BIRTH:  [birth year]  SUBJECTIVE:  CHIEF COMPLAINT:   Chief Complaint  Patient presents with  . Fever  . Weakness  . Shortness of Breath   Feeling little better cough and congestion improved  REVIEW OF SYSTEMS:  CONSTITUTIONAL: No fever, fatigue or weakness.  EYES: No blurred or double vision.  EARS, NOSE, AND THROAT: No tinnitus or ear pain.  RESPIRATORY: + cough,+  shortness of breath, wheezing or hemoptysis.  CARDIOVASCULAR: No chest pain, orthopnea, edema.  GASTROINTESTINAL: No nausea, vomiting, diarrhea or abdominal pain.  GENITOURINARY: No dysuria, hematuria.  ENDOCRINE: No polyuria, nocturia,  HEMATOLOGY: No anemia, easy bruising or bleeding SKIN: No rash or lesion. MUSCULOSKELETAL: No joint pain or arthritis.   NEUROLOGIC: No tingling, numbness, weakness.  PSYCHIATRY: No anxiety or depression.   ROS  DRUG ALLERGIES:  No Known Allergies  VITALS:  Blood pressure (!) 143/84, pulse 63, temperature (!) 97.5 F (36.4 C), temperature source Oral, resp. rate 16, height 5\' 5"  (1.651 m), weight 99 kg (218 lb 3.2 oz), SpO2 96 %.  PHYSICAL EXAMINATION:  GENERAL:  67 y.o.-year-old patient lying in the bed with no acute distress.  EYES: Pupils equal, round, reactive to light and accommodation. No scleral icterus. Extraocular muscles intact.  HEENT: Head atraumatic, normocephalic. Oropharynx and nasopharynx clear.  NECK:  Supple, no jugular venous distention. No thyroid enlargement, no tenderness.  LUNGS: Normal breath sounds bilaterally, no wheezing, rales,rhonchi or crepitation. No use of accessory muscles of respiration.  CARDIOVASCULAR: S1, S2 normal. No murmurs, rubs, or gallops.  ABDOMEN: Soft, nontender, nondistended. Bowel sounds present. No organomegaly or mass.  EXTREMITIES: No pedal edema, cyanosis, or clubbing.  NEUROLOGIC: Cranial nerves II through  XII are intact. Muscle strength 5/5 in all extremities. Sensation intact. Gait not checked.  PSYCHIATRIC: The patient is alert and oriented x 3.  SKIN: No obvious rash, lesion, or ulcer.   Physical Exam LABORATORY PANEL:   CBC Recent Labs  Lab 07/09/17 0245  WBC 4.6  HGB 12.9*  HCT 37.1*  PLT 244   ------------------------------------------------------------------------------------------------------------------  Chemistries  Recent Labs  Lab 07/03/17 2044  07/09/17 0245  NA 133*   < > 139  K 4.1   < > 3.5  CL 101   < > 108  CO2 22   < > 22  GLUCOSE 319*   < > 145*  BUN 17   < > 14  CREATININE 0.65   < > 0.71  CALCIUM 8.6*   < > 8.5*  AST 27  --   --   ALT 29  --   --   ALKPHOS 97  --   --   BILITOT 1.6*  --   --    < > = values in this interval not displayed.   ------------------------------------------------------------------------------------------------------------------  Cardiac Enzymes Recent Labs  Lab 07/03/17 2132  TROPONINI <0.03   ------------------------------------------------------------------------------------------------------------------  RADIOLOGY:  Dg Chest 2 View  Result Date: 07/08/2017 CLINICAL DATA:  Shortness of breath.  Pneumonia. EXAM: CHEST - 2 VIEW COMPARISON:  Jul 03, 2017 FINDINGS: Infiltrates in the lingula and right middle lobe persist but have improved. Tiny effusions. IMPRESSION: Persistent but improving lingular and right middle lobe infiltrates. Tiny effusions. Electronically Signed   By: Gerome Samavid  Williams III M.D   On: 07/08/2017 08:16    ASSESSMENT AND PLAN:  *Acute extensive right-sided multifocal CAP Resolving slowly  Procalcitonin level trending down Chest x-ray showed improvement yesterday  cultures, CT chest noted, ID/Dr. Sampson Goon input appreciated,  Continue Mucinex to current regimen continue Mucomyst May need oxygen on discharge  *Acute sepsis Resolved Due to multifocal CAP   *Chronic diabetes mellitus  type 2 blood sugars elevated Resume home metformin and glipizide  *Chronic benign essential hypertension Stable on current regiment    All the records are reviewed and case discussed with Care Management/Social Workerr. Management plans discussed with the patient, family and they are in agreement.  CODE STATUS: full  TOTAL TIME TAKING CARE OF THIS PATIENT: 35 minutes.     POSSIBLE D/C IN 35 DAYS, DEPENDING ON CLINICAL CONDITION.   Auburn Bilberry M.D on 07/09/2017   Between 7am to 6pm - Pager - [phone]  After 6pm go to [email or web address] - Social research officer, government  [facility]  Office  [phone]  CC: Primary care physician; Center, [facility]  Note: This dictation was prepared with Nurse, children's dictation along with smaller phrase technology. Any transcriptional errors that result from this process are unintentional.

## 2017-07-09 NOTE — Progress Notes (Signed)
[facility]ESS NOTE Date of Admission:  07/03/2017     ID: Alvin [name] is a 67 y.o. male with with CAP Active Problems:   Sepsis (HCC)  Subjective: Fevers resolved. Still on O2 but feels better   ROS  Eleven systems are reviewed and negative except per hpi  Medications:  Antibiotics Given (last 72 hours)    Date/Time Action Medication Dose Rate   07/06/17 1718 New Bag/Given   cefTRIAXone (ROCEPHIN) 2 g in sodium chloride 0.9 % 100 mL IVPB 2 g 200 mL/hr   07/07/17 1742 New Bag/Given   cefTRIAXone (ROCEPHIN) 2 g in sodium chloride 0.9 % 100 mL IVPB 2 g 200 mL/hr   07/08/17 1716 New Bag/Given   cefTRIAXone (ROCEPHIN) 2 g in sodium chloride 0.9 % 100 mL IVPB 2 g 200 mL/hr     . acetylcysteine  4 mL Nebulization BID  . allopurinol  300 mg Oral Daily  . docusate sodium  100 mg Oral BID  . glipiZIDE  5 mg Oral BID AC  . guaiFENesin  600 mg Oral BID  . heparin  5,000 Units Subcutaneous Q8H  . insulin aspart  0-15 Units Subcutaneous TID WC  . insulin aspart  0-5 Units Subcutaneous QHS  . insulin aspart  3 Units Subcutaneous TID WC  . insulin glargine  10 Units Subcutaneous QHS  . ipratropium-albuterol  3 mL Nebulization BID  . levothyroxine  88 mcg Oral QAC breakfast  . metFORMIN  500 mg Oral BID WC  . pantoprazole  40 mg Oral QAC breakfast  . quinapril  20 mg Oral Daily  . tamsulosin  0.4 mg Oral Daily    Objective: Vital signs in last 24 hours: Temp:  [97.5 F (36.4 C)-98.5 F (36.9 C)] 97.5 F (36.4 C) (06/03 0757) Pulse Rate:  [63-74] 63 (06/03 0757) Resp:  [16] 16 (06/02 2355) BP: (138-143)/(83-84) 143/84 (06/03 0757) SpO2:  [93 %-97 %] 96 % (06/03 0757) Weight:  [99 kg (218 lb 3.2 oz)] 99 kg (218 lb 3.2 oz) (06/03 0439) Constitutional: He is oriented to person, place, and time. He appears well-developed and well-nourished. No distress. Obese. On 2L O2 HENT: anicteric Mouth/Throat: Oropharynx is clear and moist. No oropharyngeal  exudate.  Cardiovascular: Normal rate, regular rhythm and normal heart sounds. Exam reveals no gallop and no friction rub.  No murmur heard.  Pulmonary/Chest: poor air movement, Bil Rhonic R > L Abdominal: Soft. Bowel sounds are normal. He exhibits no distension. There is no tenderness.  Lymphadenopathy:  He has no cervical adenopathy.  Neurological: He is alert and oriented to person, place, and time.  Skin: Skin is warm and dry. No rash noted. No erythema.  Psychiatric: He has a normal mood and affect. His behavior is normal.     Lab Results Recent Labs    07/08/17 0521 07/09/17 0245  WBC 3.1* 4.6  HGB 13.3 12.9*  HCT 37.0* 37.1*  NA 138 139  K 3.7 3.5  CL 109 108  CO2 21* 22  BUN 15 14  CREATININE 0.72 0.71    Microbiology: Results for orders placed or performed during the hospital encounter of 07/03/17  Blood Culture (routine x 2)     Status: None   Collection Time: 07/03/17  9:32 PM  Result Value Ref Range Status   Specimen Description BLOOD BLOOD LEFT WRIST  Final   Special Requests   Final    BOTTLES DRAWN AEROBIC AND ANAEROBIC Blood Culture adequate volume  Culture   Final    NO GROWTH 5 DAYS Performed at [facility], [address] [place], [place] [address]    Report Status 07/08/2017 FINAL  Final  Blood Culture (routine x 2)     Status: None   Collection Time: 07/03/17  9:34 PM  Result Value Ref Range Status   Specimen Description BLOOD Blood Culture adequate volume  Final   Special Requests   Final    BOTTLES DRAWN AEROBIC AND ANAEROBIC BLOOD RIGHT FOREARM   Culture   Final    NO GROWTH 5 DAYS Performed at [facility], [address] [place], [place] [address]    Report Status 07/08/2017 FINAL  Final  MRSA PCR Screening     Status: None   Collection Time: 07/06/17  4:47 PM  Result Value Ref Range Status   MRSA by PCR NEGATIVE NEGATIVE Final    Comment:        The GeneXpert MRSA Assay (FDA approved for NASAL  specimens only), is one component of a comprehensive MRSA colonization surveillance program. It is not intended to diagnose MRSA infection nor to guide or monitor treatment for MRSA infections. Performed at [facility], [address] [place], [place] [address]   Culture, expectorated sputum-assessment     Status: None   Collection Time: 07/06/17  4:47 PM  Result Value Ref Range Status   Specimen Description SPUTUM  Final   Special Requests Normal  Final   Sputum evaluation   Final    THIS SPECIMEN IS ACCEPTABLE FOR SPUTUM CULTURE Performed at [facility], [address] [place], [place] [address]    Report Status 07/06/2017 FINAL  Final  Culture, respiratory (NON-Expectorated)     Status: None   Collection Time: 07/06/17  4:47 PM  Result Value Ref Range Status   Specimen Description   Final    SPUTUM Performed at [facility], [address] [place], [place] [address]    Special Requests   Final    Normal Reflexed from [id number] Performed at [facility], [address] [place], [place] [address]    Gram Stain   Final    ABUNDANT WBC PRESENT, PREDOMINANTLY MONONUCLEAR MODERATE SQUAMOUS EPITHELIAL CELLS PRESENT ABUNDANT GRAM NEGATIVE RODS MODERATE GRAM POSITIVE COCCI IN PAIRS RARE GRAM POSITIVE RODS    Culture   Final    Consistent with normal respiratory flora. Performed at [facility], [address], [place], [place] [address]    Report Status 07/09/2017 FINAL  Final    Studies/Results: Dg Chest 2 View  Result Date: 07/08/2017 CLINICAL DATA:  Shortness of breath.  Pneumonia. EXAM: CHEST - 2 VIEW COMPARISON:  Jul 03, 2017 FINDINGS: Infiltrates in the lingula and right middle lobe persist but have improved. Tiny effusions. IMPRESSION: Persistent but improving lingular and right middle lobe infiltrates. Tiny effusions. Electronically Signed   By: Gerome Sam III M.D   On: 07/08/2017 08:16     Assessment/Plan: Alvin [name] is a 67 y.o. male with hx DM, admitted with acute cough, fevers x 3-4 days. On admit his wbc was nml but febrile and CT with multifocal infiltrate. BCX neg, HIV neg, on ctx and azithro with some slow  Improvement.  MRSA PCR neg.  Sputum cx with resp flora. Legionella and Strep PNA uag neg. He denies any TB risk factors or exposures. Denies aspiration.  June 3rd - Day 7 of antibiotics. No fevers for 4 days.  Improving but  still on 2 L O2.   CXR with persistent but improving RML infiltrates Recommendations Cont ctx. He has completed azithromycin.  If stable tomorrow can dc on amoxicillin 1000 mg TID to complete a 10 day course of total abx. Thank you very much for the consult. Will follow with you.  Mick Sell   07/09/2017, 2:55 PM

## 2017-07-10 LAB — GLUCOSE, CAPILLARY
GLUCOSE-CAPILLARY: 231 mg/dL — AB (ref 65–99)
Glucose-Capillary: 144 mg/dL — ABNORMAL HIGH (ref 65–99)

## 2017-07-10 MED ORDER — CEFUROXIME AXETIL 500 MG PO TABS: 500.0000 mg | ORAL_TABLET | Freq: Two times a day (BID) | ORAL | 0 refills | Status: AC

## 2017-07-10 MED ORDER — GUAIFENESIN-DM 100-10 MG/5ML PO SYRP: 5.0000 mL | ORAL_SOLUTION | ORAL | 0 refills | Status: DC | PRN

## 2017-07-10 MED ORDER — GUAIFENESIN ER 600 MG PO TB12: 600.0000 mg | ORAL_TABLET | Freq: Two times a day (BID) | ORAL | 0 refills | Status: AC

## 2017-07-10 NOTE — Discharge Summary (Signed)
[employer] - [place] at [facility]  Alvin [name], [place]67 y.o., DOB 1950/11/25, MRN [id number]. Admission date: 07/03/2017 Discharge Date 07/10/2017 [facility] Admitting Physician Cammy CopaAngela Maier, MD  Admission Diagnosis  Hyperglycemia [R73.9] Hypoxia [R09.02] Sepsis, due to unspecified organism [facility](HCC) [A41.9] Community acquired pneumonia of right lower lobe of lung [facility](HCC) [J18.1]  Discharge Diagnosis   Active Problems:  Sepsis (HCC) Acute right-sided multifocal community-acquired pneumonia Diabetes type 2 Essential hypertension Anxiety    Hospital Course Alvin [name]  is a 67 y.o. male with a known history of diabetes type 2 and hypertension. Patient presented to emergency room for fever and chills, productive cough, generalized weakness and fatigue going on for the past 24 hours, gradually getting worse.  Patient's chest x-ray showed right-sided extensive pneumonia.  Patient was admitted and started on antibiotics.  Patient continued to be short of breath.  He had a CT scan of the chest which showed extensive pneumonia.  Pt was seen by ID he was continued on antibiotics.  Patient is much improved now shortness of breath improved.              Consults  pulmonary  Significant Tests:  See full reports for all details     Dg Chest 2 View  Result Date: 07/08/2017 CLINICAL DATA:  Shortness of breath.  Pneumonia. EXAM: CHEST - 2 VIEW COMPARISON:  Jul 03, 2017 FINDINGS: Infiltrates in the lingula and right middle lobe persist but have improved. Tiny effusions. IMPRESSION: Persistent but improving lingular and right middle lobe infiltrates. Tiny effusions. Electronically Signed   By: Gerome Samavid  Williams III M.D   On: 07/08/2017 08:16   Dg Chest 2 View  Result Date: 07/03/2017 CLINICAL DATA:  Fever, shortness of breath, weakness EXAM: CHEST - 2 VIEW COMPARISON:  06/30/2015 FINDINGS: Right lower lung opacity, suspicious for right  middle and lower lobe pneumonia. Mild left basilar atelectasis. No definite pleural effusions. No pneumothorax. The heart is top-normal in size. IMPRESSION: Right lower lung opacity, suspicious for right middle and lower lobe pneumonia. Electronically Signed   By: Charline BillsSriyesh  Krishnan M.D.   On: 07/03/2017 20:55   Ct Chest W Contrast  Result Date: 07/05/2017 CLINICAL DATA:  Fever and chills, cough, generalized weakness and fatigue over the last 24 hours, worse recently EXAM: CT CHEST WITH CONTRAST TECHNIQUE: Multidetector CT imaging of the chest was performed during intravenous contrast administration. CONTRAST:  75mL OMNIPAQUE IOHEXOL 300 MG/ML  SOLN COMPARISON:  Chest x-ray of 07/03/2017 FINDINGS: Cardiovascular: The heart is within normal limits in size. No pericardial effusion is seen. Coronary artery calcifications are noted primarily in the distribution of the right main coronary artery. No abnormality of the thoracic aorta is seen. The pulmonary arteries are not as well opacified but no central abnormality is seen. Mediastinum/Nodes: There are small mediastinal and hilar lymph nodes present none of which are pathologically enlarged. Thyroid gland is unremarkable. Lungs/Pleura: On lung window images, there is parenchymal opacity in the inferior right upper lobe, extensively throughout the right middle lobe and in the posterior right lower lobe and to a lesser degree posteromedial left lower lobe consistent with multifocal pneumonia. Small bilateral pleural effusions are present. Follow-up is recommended to ensure clearing. The central airway are patent. Upper Abdomen: The liver is low in attenuation consistent with hepatic steatosis. No calcified gallstones are seen. There appears to be a tiny amount of ascites perihepatic in location. Musculoskeletal: There are mild degenerative changes in the mid to lower  thoracic spine. Alignment is normal. IMPRESSION: 1. Parenchymal opacities extensively throughout the  right middle lobe, and to a lesser degree within the right lower lobe, left lower lobe, and inferior right upper lobe, consistent with multifocal pneumonia. Recommend follow-up to ensure clearing. 2. Small bilateral pleural effusions. 3. Low-attenuation of the liver may indicate hepatic steatosis. Correlate with LFTs. Electronically Signed   By: Dwyane Dee M.D.   On: 07/05/2017 09:19       Today   Subjective:   Alvin [name] doing much better breathing improved Objective:   Blood pressure 138/73, pulse 83, temperature 98.2 F (36.8 C), temperature source Oral, resp. rate 18, height 5\' 5"  (1.651 m), weight 99 kg (218 lb 3.2 oz), SpO2 91 %.  .  Intake/Output Summary (Last 24 hours) at 07/10/2017 1441 Last data filed at 07/10/2017 1021 Gross per 24 hour  Intake 480 ml  Output 375 ml  Net 105 ml    Exam VITAL SIGNS: Blood pressure 138/73, pulse 83, temperature 98.2 F (36.8 C), temperature source Oral, resp. rate 18, height 5\' 5"  (1.651 m), weight 99 kg (218 lb 3.2 oz), SpO2 91 %.  GENERAL:  67 y.o.-year-old patient lying in the bed with no acute distress.  EYES: Pupils equal, round, reactive to light and accommodation. No scleral icterus. Extraocular muscles intact.  HEENT: Head atraumatic, normocephalic. Oropharynx and nasopharynx clear.  NECK:  Supple, no jugular venous distention. No thyroid enlargement, no tenderness.  LUNGS: Normal breath sounds bilaterally, no wheezing, rales,rhonchi or crepitation. No use of accessory muscles of respiration.  CARDIOVASCULAR: S1, S2 normal. No murmurs, rubs, or gallops.  ABDOMEN: Soft, nontender, nondistended. Bowel sounds present. No organomegaly or mass.  EXTREMITIES: No pedal edema, cyanosis, or clubbing.  NEUROLOGIC: Cranial nerves II through XII are intact. Muscle strength 5/5 in all extremities. Sensation intact. Gait not checked.  PSYCHIATRIC: The patient is alert and oriented x 3.  SKIN: No obvious rash, lesion, or ulcer.   Data  Review     CBC w Diff:  Lab Results  Component Value Date   WBC 4.6 07/09/2017   HGB 12.9 (L) 07/09/2017   HGB 15.2 06/02/2014   HCT 37.1 (L) 07/09/2017   HCT 43.4 06/02/2014   PLT 244 07/09/2017   PLT 114 (L) 06/02/2014   LYMPHOPCT 12 07/03/2017   LYMPHOPCT 14.9 06/02/2014   MONOPCT 11 07/03/2017   MONOPCT 5.1 06/02/2014   EOSPCT 0 07/03/2017   EOSPCT 1.2 06/02/2014   BASOPCT 0 07/03/2017   BASOPCT 0.3 06/02/2014   CMP:  Lab Results  Component Value Date   NA 139 07/09/2017   NA 137 06/02/2014   K 3.5 07/09/2017   K 3.8 06/02/2014   CL 108 07/09/2017   CL 103 06/02/2014   CO2 22 07/09/2017   CO2 27 06/02/2014   BUN 14 07/09/2017   BUN 15 06/02/2014   CREATININE 0.71 07/09/2017   CREATININE 0.69 06/02/2014   PROT 7.2 07/03/2017   PROT 7.9 06/02/2014   ALBUMIN 3.5 07/03/2017   ALBUMIN 4.8 06/02/2014   BILITOT 1.6 (H) 07/03/2017   BILITOT 0.8 06/02/2014   ALKPHOS 97 07/03/2017   ALKPHOS 78 06/02/2014   AST 27 07/03/2017   AST 35 06/02/2014   ALT 29 07/03/2017   ALT 45 06/02/2014  .  Micro Results Recent Results (from the past 240 hour(s))  Group A Strep by PCR     Status: None   Collection Time: 07/02/17  9:22 AM  Result Value Ref Range  Status   Group A Strep by PCR NOT DETECTED NOT DETECTED Final    Comment: Performed at [facility], [address] [place], [place] [address]  Blood Culture (routine x 2)     Status: None   Collection Time: 07/03/17  9:32 PM  Result Value Ref Range Status   Specimen Description BLOOD BLOOD LEFT WRIST  Final   Special Requests   Final    BOTTLES DRAWN AEROBIC AND ANAEROBIC Blood Culture adequate volume   Culture   Final    NO GROWTH 5 DAYS Performed at [facility], [address] [place], [place] [address]    Report Status 07/08/2017 FINAL  Final  Blood Culture (routine x 2)     Status: None   Collection Time: 07/03/17  9:34 PM  Result Value Ref Range Status   Specimen Description BLOOD  Blood Culture adequate volume  Final   Special Requests   Final    BOTTLES DRAWN AEROBIC AND ANAEROBIC BLOOD RIGHT FOREARM   Culture   Final    NO GROWTH 5 DAYS Performed at [facility], [address] [place], [place] [address]    Report Status 07/08/2017 FINAL  Final  MRSA PCR Screening     Status: None   Collection Time: 07/06/17  4:47 PM  Result Value Ref Range Status   MRSA by PCR NEGATIVE NEGATIVE Final    Comment:        The GeneXpert MRSA Assay (FDA approved for NASAL specimens only), is one component of a comprehensive MRSA colonization surveillance program. It is not intended to diagnose MRSA infection nor to guide or monitor treatment for MRSA infections. Performed at [facility], [address] [place], [place] [address]   Culture, expectorated sputum-assessment     Status: None   Collection Time: 07/06/17  4:47 PM  Result Value Ref Range Status   Specimen Description SPUTUM  Final   Special Requests Normal  Final   Sputum evaluation   Final    THIS SPECIMEN IS ACCEPTABLE FOR SPUTUM CULTURE Performed at [facility], [address] [place], [place] [address]    Report Status 07/06/2017 FINAL  Final  Culture, respiratory (NON-Expectorated)     Status: None   Collection Time: 07/06/17  4:47 PM  Result Value Ref Range Status   Specimen Description   Final    SPUTUM Performed at [facility], [address], [place], [place] [address]    Special Requests   Final    Normal Reflexed from [id number] Performed at [facility], [address] [place], [place] [address]    Gram Stain   Final    ABUNDANT WBC PRESENT, PREDOMINANTLY MONONUCLEAR MODERATE SQUAMOUS EPITHELIAL CELLS PRESENT ABUNDANT GRAM NEGATIVE RODS MODERATE GRAM POSITIVE COCCI IN PAIRS RARE GRAM POSITIVE RODS    Culture   Final    Consistent with normal respiratory flora. Performed at [facility], [address], [place], [place]  [address]    Report Status 07/09/2017 FINAL  Final        Code Status Orders  (From admission, onward)        Start     Ordered   07/03/17 2338  Full code  Continuous     07/03/17 2337    Code Status History    This patient has a current code status but no historical code status.          Follow-up Information  [facility] Follow up in 6 day(s).   Specialty:  General Practice Why:  hosp f/u Contact information: [address] [place] [address] [phone]           Discharge Medications   Allergies as of 07/10/2017   No Known Allergies     Medication List    TAKE these medications   allopurinol 300 MG tablet Commonly known as:  ZYLOPRIM Take 300 mg by mouth daily.   cefUROXime 500 MG tablet Commonly known as:  CEFTIN Take 1 tablet (500 mg total) by mouth 2 (two) times daily for 7 days.   cetirizine 10 MG tablet Commonly known as:  ZYRTEC Take 10 mg by mouth.   fluticasone 50 MCG/ACT nasal spray Commonly known as:  FLONASE 1 spray by Each Nare route daily.   glipiZIDE 5 MG tablet Commonly known as:  GLUCOTROL Take 5 mg by mouth 2 (two) times daily before a meal.   guaiFENesin 600 MG 12 hr tablet Commonly known as:  MUCINEX Take 1 tablet (600 mg total) by mouth 2 (two) times daily for 10 days.   guaiFENesin-dextromethorphan 100-10 MG/5ML syrup Commonly known as:  ROBITUSSIN DM Take 5 mLs by mouth every 4 (four) hours as needed for cough.   levothyroxine 88 MCG tablet Commonly known as:  SYNTHROID, LEVOTHROID Take 1 tablet by mouth daily.   metFORMIN 500 MG tablet Commonly known as:  GLUCOPHAGE Take 1 tablet by mouth 2 (two) times daily.   pantoprazole 40 MG tablet Commonly known as:  PROTONIX Take 1 tablet (40 mg total) by mouth daily before breakfast.   quinapril 20 MG tablet Commonly known as:  ACCUPRIL Take 20 mg by mouth daily.   tamsulosin 0.4 MG Caps capsule Commonly known as:   FLOMAX Take 1 capsule by mouth daily.          Total Time in preparing paper work, data evaluation and todays exam - 35 minutes  Auburn Bilberry M.D on 07/10/2017 at 2:41 PM [employer]   Office  [phone]

## 2017-07-10 NOTE — Plan of Care (Signed)
  Problem: Activity: Goal: Ability to tolerate increased activity will improve Outcome: Adequate for Discharge   Problem: Clinical Measurements: Goal: Ability to maintain a body temperature in the normal range will improve Outcome: Adequate for Discharge   Problem: Respiratory: Goal: Ability to maintain adequate ventilation will improve Outcome: Adequate for Discharge Goal: Ability to maintain a clear airway will improve Outcome: Adequate for Discharge   Problem: Education: Goal: Knowledge of General Education information will improve Outcome: Adequate for Discharge   Problem: Health Behavior/Discharge Planning: Goal: Ability to manage health-related needs will improve Outcome: Adequate for Discharge   Problem: Clinical Measurements: Goal: Ability to maintain clinical measurements within normal limits will improve Outcome: Adequate for Discharge Goal: Will remain free from infection Outcome: Adequate for Discharge Goal: Diagnostic test results will improve Outcome: Adequate for Discharge Goal: Respiratory complications will improve Outcome: Adequate for Discharge Goal: Cardiovascular complication will be avoided Outcome: Adequate for Discharge   Problem: Activity: Goal: Risk for activity intolerance will decrease Outcome: Adequate for Discharge   Problem: Nutrition: Goal: Adequate nutrition will be maintained Outcome: Adequate for Discharge   Problem: Coping: Goal: Level of anxiety will decrease Outcome: Adequate for Discharge   Problem: Elimination: Goal: Will not experience complications related to bowel motility Outcome: Adequate for Discharge Goal: Will not experience complications related to urinary retention Outcome: Adequate for Discharge   Problem: Pain Managment: Goal: General experience of comfort will improve Outcome: Adequate for Discharge   Problem: Safety: Goal: Ability to remain free from injury will improve Outcome: Adequate for Discharge    Problem: Skin Integrity: Goal: Risk for impaired skin integrity will decrease Outcome: Adequate for Discharge   Problem: Fluid Volume: Goal: Hemodynamic stability will improve Outcome: Adequate for Discharge   Problem: Clinical Measurements: Goal: Diagnostic test results will improve Outcome: Adequate for Discharge Goal: Signs and symptoms of infection will decrease Outcome: Adequate for Discharge   Problem: Respiratory: Goal: Ability to maintain adequate ventilation will improve Outcome: Adequate for Discharge

## 2017-09-12 IMAGING — CR DG ABDOMEN ACUTE W/ 1V CHEST
4 series · 4 of 4 positions shown · non-contrast
Comparison: PA and lateral chest x-ray March 28, 2015

CLINICAL DATA: Burning sensation in the abdomen for the past 4 days
with no history of nausea vomiting diarrhea or constipation.

EXAM:
DG ABDOMEN ACUTE W/ 1V CHEST

[chest pa]
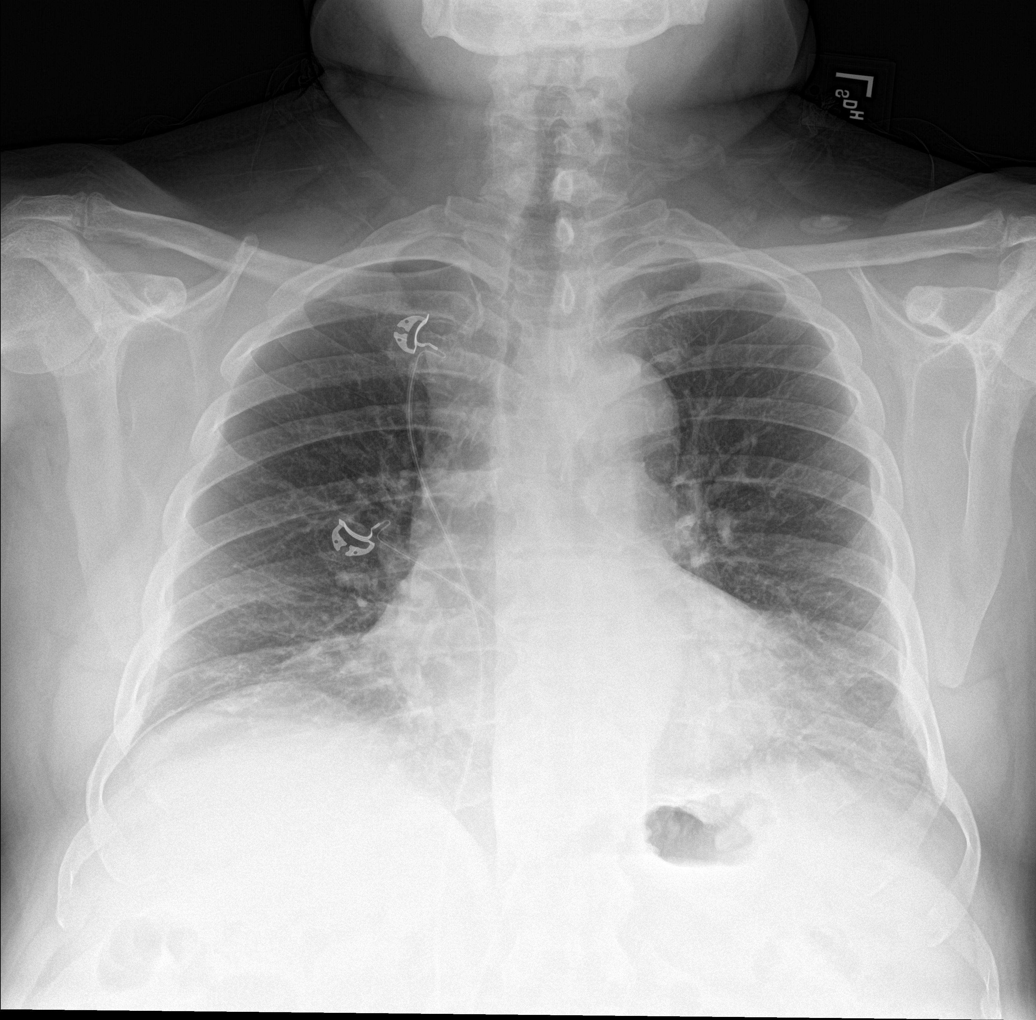

[abdomen erect]
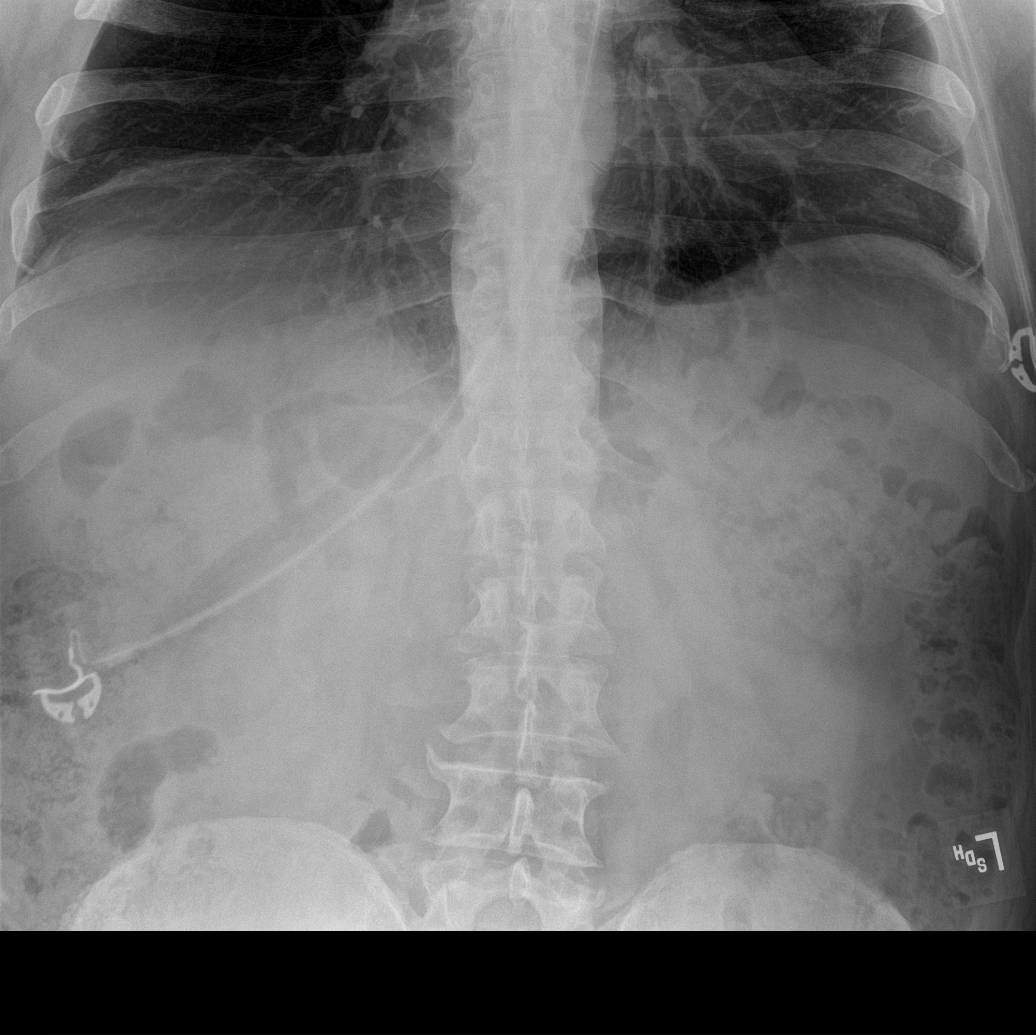

[abdomen supine (1 of 2)]
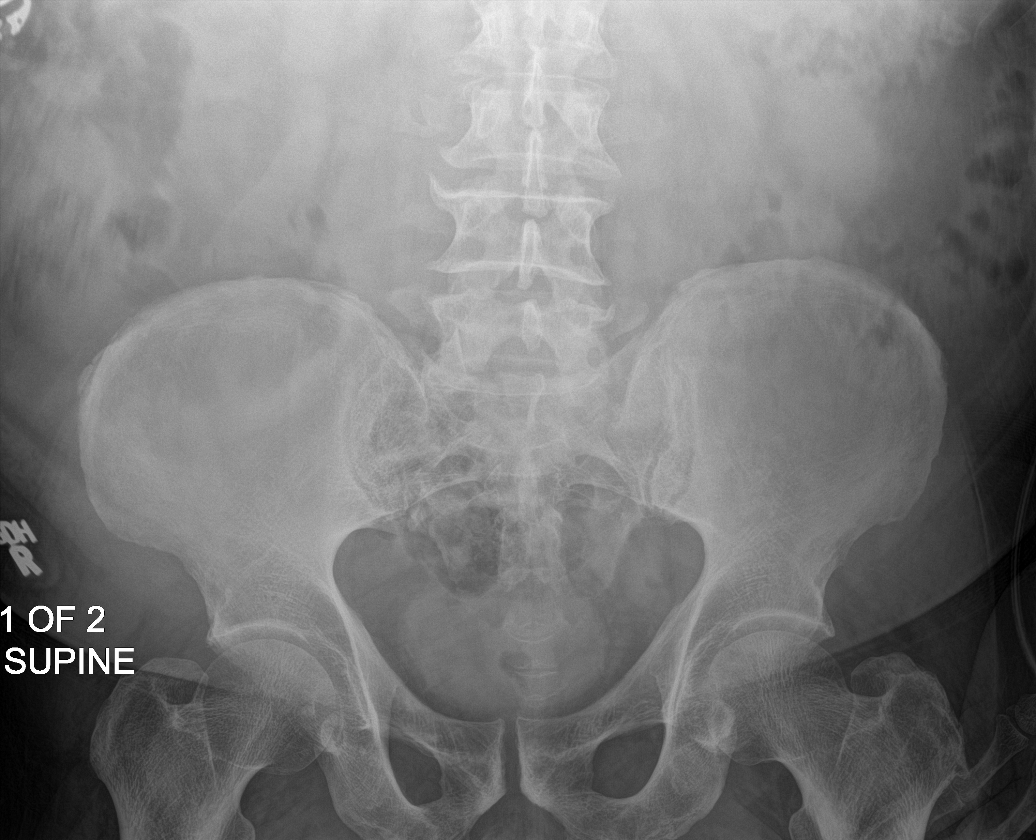

[abdomen supine (2 of 2)]
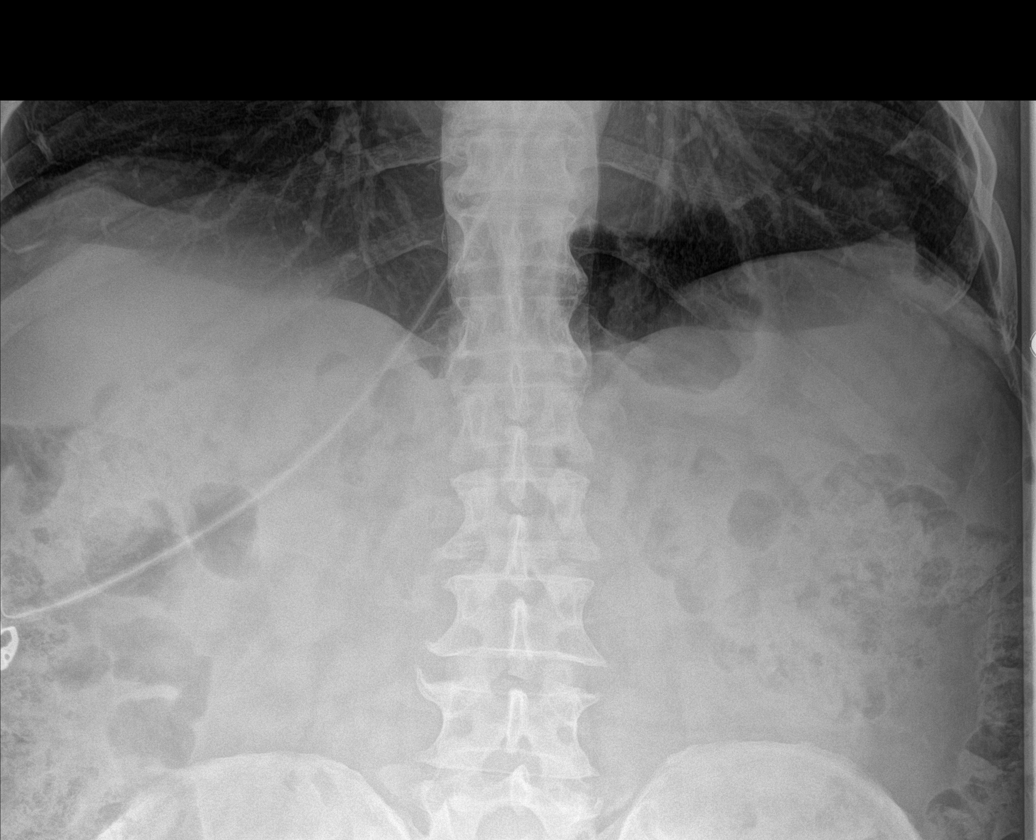

[4 of 4 positions shown; findings below may reference images not displayed]

FINDINGS: The lungs are mildly hypoinflated. The basilar lung markings are
coarse but stable. There is no alveolar infiltrate nor pleural
effusion. The cardiac silhouette is mildly enlarged but stable. The
pulmonary vascularity is not engorged.

Within the abdomen the colonic stool burden is moderately increased.
There is no small or large bowel obstructive pattern. No free
extraluminal gas is observed. No kidney stones are demonstrated.
There is mild degenerative disc disease of the lower lumbar spine.
IMPRESSION: 1. Mild pulmonary hypoinflation with basilar interstitial
prominence, stable. No acute pneumonia is observed.
2. Moderately increased colonic stool burden without evidence of
obstruction or ileus or gastroenteritis. No calcified urinary tract
stones are observed.

## 2018-12-22 ENCOUNTER — Other Ambulatory Visit: Payer: Self-pay

## 2018-12-22 ENCOUNTER — Emergency Department
Admission: EM | Admit: 2018-12-22 | Discharge: 2018-12-22 | Disposition: A | Discharge status: Home / Self Care | Payer: Medicare Other | Attending: Emergency Medicine | Admitting: Emergency Medicine

## 2018-12-22 DIAGNOSIS — I10 Essential (primary) hypertension: Secondary | ICD-10-CM | POA: Diagnosis not present

## 2018-12-22 DIAGNOSIS — E039 Hypothyroidism, unspecified: Secondary | ICD-10-CM | POA: Diagnosis not present

## 2018-12-22 DIAGNOSIS — E119 Type 2 diabetes mellitus without complications: Secondary | ICD-10-CM | POA: Diagnosis not present

## 2018-12-22 DIAGNOSIS — Z79899 Other long term (current) drug therapy: Secondary | ICD-10-CM | POA: Diagnosis not present

## 2018-12-22 DIAGNOSIS — K0889 Other specified disorders of teeth and supporting structures: Secondary | ICD-10-CM | POA: Diagnosis present

## 2018-12-22 DIAGNOSIS — K047 Periapical abscess without sinus: Secondary | ICD-10-CM | POA: Diagnosis not present

## 2018-12-22 DIAGNOSIS — Z7984 Long term (current) use of oral hypoglycemic drugs: Secondary | ICD-10-CM | POA: Insufficient documentation

## 2018-12-22 HISTORY — DX: Pure hypercholesterolemia, unspecified: E78.00

## 2018-12-22 HISTORY — DX: Disorder of thyroid, unspecified: E07.9

## 2018-12-22 MED ORDER — TRAMADOL HCL 50 MG PO TABS
50.0000 mg | ORAL_TABLET | Freq: Once | ORAL | Status: DC
  Filled 2018-12-22: qty 1

## 2018-12-22 MED ORDER — AMOXICILLIN 500 MG PO CAPS
500.0000 mg | ORAL_CAPSULE | Freq: Once | ORAL | Status: AC
  Administered 2018-12-22: 19:00:00 500 mg via ORAL
  Filled 2018-12-22: qty 1

## 2018-12-22 MED ORDER — TRAMADOL HCL 50 MG PO TABS: 50.0000 mg | ORAL_TABLET | Freq: Four times a day (QID) | ORAL | 0 refills | Status: AC | PRN

## 2018-12-22 NOTE — ED Triage Notes (Signed)
Spanish interpreter used for triage. C/o R lower dental pain. States yesterday afternoon felt like it was swollen. Pt states "I want a dentist to see me here." informed pt that the ED doesn't have dentists but we refer pt to dentists in the area. Verbalized understanding.

## 2018-12-22 NOTE — Discharge Instructions (Signed)
OPTIONS FOR DENTAL FOLLOW UP CARE ° °[place] [facility] - Local Safety Net Dental Clinics °http://www.ncdhhs.gov/dph/oralhealth/services/safetynetclinics.htm °  °[facility] (336-562-3123) ° °[facility] [place] ([phone]) ° °[facility] ([phone] ext [phone]) ° °[facility] ([phone]) ° °[facility] ([phone]) °This clinic caters to the indigent population and is on a lottery system. °Location: °[facility], [address], [place] °Clinic Hours: °Wednesdays from 6pm - 9pm, patients seen by a lottery system. °For dates, call or go to www.med.[facility].edu/[facility]/patients/Dental-[facility] °Services: °Cleanings, fillings and simple extractions. °Payment Options: °DENTAL WORK IS FREE OF CHARGE. Bring proof of income or support. °Best way to get seen: °Arrive at 5:15 pm - this is a lottery, NOT first come/first serve, so arriving earlier will not increase your chances of being seen. °  °  °[facility] °[phone] °Select option 1 for emergencies °  °Location: °[facility], [address], [place] °Clinic Hours: °No walk-ins accepted - call the day before to schedule an appointment. °Check in times are 9:30 am and 1:30 pm. °Services: °Simple extractions, temporary fillings, pulpectomy/pulp debridement, uncomplicated abscess drainage. °Payment Options: °PAYMENT IS DUE AT THE TIME OF SERVICE.  Fee is usually $100-200, additional surgical procedures (e.g. abscess drainage) may be extra. °Cash, checks, Visa/MasterCard accepted.  Can file Medicaid if patient is covered for dental - patient should call case worker to check. °No discount for [facility] patients. °Best way to get seen: °MUST call the day before and get onto the schedule. Can usually be seen the next 1-2 days. No walk-ins accepted. °  °  °[facility] °[phone] °   °Location: °[facility], [address], [place] °Clinic Hours: °M, W, Th, F 8am or 1:30pm, Tues 9a or 1:30 - first come/first served. °Services: °Simple extractions, temporary fillings, uncomplicated abscess drainage.  You do not need to be an [place] resident. °Payment Options: °PAYMENT IS DUE AT THE TIME OF SERVICE. °Dental insurance, otherwise sliding scale - bring proof of income or support. °Depending on income and treatment needed, cost is usually $50-200. °Best way to get seen: °Arrive early as it is first come/first served. °  °  °[facility] °[phone] °  °Location: °[address] °Clinic Hours: °Mon-Thu 8a-5p °Services: °Most basic dental services including extractions and fillings. °Payment Options: °PAYMENT IS DUE AT THE TIME OF SERVICE. °Sliding scale, up to 50% off - bring proof if income or support. °Medicaid with dental option accepted. °Best way to get seen: °Call to schedule an appointment, can usually be seen within 2 weeks OR they will try to see walk-ins - show up at 8a or 2p (you may have to wait). °  °  °[facility] °[phone] °[place] RESIDENTS ONLY °  °Location: °[facility], [address], [place], [place] [address] °Clinic Hours: By appointment only. °Monday - Thursday 8am-5pm, Friday 8am-12pm °Services: Cleanings, fillings, extractions. °Payment Options: °PAYMENT IS DUE AT THE TIME OF SERVICE. °Cash, Visa or MasterCard. Sliding scale - $30 minimum per service. °Best way to get seen: °Come in to office, complete packet and make an appointment - need proof of income °or support monies for each household member and proof of [place] residence. °Usually takes about a month to get in. °  °  °[facility] °[phone] °  °Location: °[address]   [place] °Clinic Hours: Walk-in Urgent Care Dental Services are offered Monday-Friday  mornings only. °The numbers of emergencies accepted daily is limited to the number of °providers available. °Maximum 15 - Mondays, Wednesdays & Thursdays °Maximum 10 - Tuesdays & Fridays °Services: °You do not need to be a [place] resident to be seen for a dental emergency. °Emergencies are defined as pain, swelling, abnormal bleeding, or dental trauma. Walkins will receive x-rays if needed. °NOTE: Dental cleaning is not an emergency. °Payment Options: °PAYMENT IS DUE AT THE TIME OF SERVICE. °Minimum co-pay is $40.00 for uninsured patients. °Minimum co-pay is $3.00 for Medicaid with dental coverage. °Dental Insurance is accepted and must be presented at time of visit. °Medicare does not cover dental. °Forms of payment: Cash, credit card, checks. °Best way to get seen: °If not previously registered with the clinic, walk-in dental registration begins at 7:15 am and is on a first come/first serve basis. °If previously registered with the clinic, call to make an appointment. °  °  °[facility] °[phone] °[place] RESIDENTS ONLY °  °Location: °[address], [place], [place] °Clinic Hours: °Mon-Thu 10a-2p °Services: Extractions only! °Payment Options: °FREE (donations accepted) - bring proof of income or support °Best way to get seen: °Call and schedule an appointment OR come at 8am on the 1st Monday of every month (except for holidays) when it is first come/first served. °  °  °[facility] °[phone] °  °Location: °[address], [place] °Clinic Hours: °Friday mornings °Services, Payment Options, Best way to get seen: °Call for info °

## 2018-12-22 NOTE — ED Notes (Signed)
Hours for [facility] were obtained from internet and given to patient via interpreter.

## 2018-12-22 NOTE — ED Provider Notes (Signed)
[facility] Emergency Department Provider Note  ____________________________________________  Time seen: Approximately 5:55 PM  I have reviewed the triage vital signs and the nursing notes.   HISTORY  Chief Complaint Dental Pain    HPI Alvin [name] is a 68 y.o. male presents to the emergency department with 6 out of 10 right-sided lower jaw pain and dental pain on the affected side.  Patient localizes pain to inferior 29.  He denies fever and chills at home.  No pain underneath the tongue.  Patient has not made an appointment with a local dentist but would like a referral to 1.  He is maintaining his own secretions and denies difficulty swallowing.  No other alleviating measures have been attempted.        Past Medical History:  Diagnosis Date  . Anxiety   . Diabetes mellitus without complication (HCC)   . High cholesterol   . Hypertension   . Thyroid disease     Patient Active Problem List   Diagnosis Date Noted  . Sepsis (HCC) 07/03/2017  . Hx of colonic polyps   . Gastric polyp   . Heartburn   . Peptic ulcer of stomach   . Gastritis   . Abdominal pain, epigastric   . Panic attack 07/12/2015  . Right lower quadrant abdominal pain 12/01/2014  . Gallstones 12/01/2014  . Encounter for screening for malignant neoplasm of colon 01/24/2013  . Abdominal bloating 01/24/2013    Past Surgical History:  Procedure Laterality Date  . COLON SURGERY  approx 2007   cyst removed   . COLONOSCOPY WITH PROPOFOL N/A 03/06/2016   Procedure: COLONOSCOPY WITH PROPOFOL;  Surgeon: Midge Miniumarren Wohl, MD;  Location[facility];  Service: Endoscopy;  Laterality: N/A;  . ESOPHAGOGASTRODUODENOSCOPY (EGD) WITH PROPOFOL N/A 08/19/2015   Procedure: ESOPHAGOGASTRODUODENOSCOPY (EGD) WITH PROPOFOL;  Surgeon: Midge Miniumarren Wohl, MD;  Location[facility];  Service: Endoscopy;  Laterality: N/A;  . ESOPHAGOGASTRODUODENOSCOPY (EGD) WITH PROPOFOL N/A 03/06/2016   Midge Miniumarren  Wohl, MD - PT NEEDS REPEAT IN 03/2017  . PANCREATIC CYST EXCISION      Prior to Admission medications   Medication Sig Start Date End Date Taking? Authorizing Provider  allopurinol (ZYLOPRIM) 300 MG tablet Take 300 mg by mouth daily.     [provider]  atorvastatin (LIPITOR) 20 MG tablet Take 20 mg by mouth daily. 11/27/18   [provider]  cetirizine (ZYRTEC) 10 MG tablet Take 10 mg by mouth.    [provider]  fluticasone (FLONASE) 50 MCG/ACT nasal spray 1 spray by Each Nare route daily.    [provider]  GLIPIZIDE XL 10 MG 24 hr tablet Take 10 mg by mouth daily. 10/09/18   [provider]  JARDIANCE 10 MG TABS tablet Take 10 mg by mouth daily. 10/15/18   [provider]  levothyroxine (SYNTHROID) 100 MCG tablet Take 100 mcg by mouth daily. 10/09/18   [provider]  lidocaine (XYLOCAINE) 2 % solution Use as directed 5 mLs in the mouth or throat 5 (five) times daily. 11/04/18   [provider]  metFORMIN (GLUCOPHAGE) 500 MG tablet Take 1 tablet by mouth 2 (two) times daily. 05/10/17 05/10/18  [provider]  naproxen (NAPROSYN) 500 MG tablet Take 500 mg by mouth 2 (two) times daily. 11/27/18   [provider]  pantoprazole (PROTONIX) 40 MG tablet Take 1 tablet (40 mg total) by mouth daily before breakfast. 08/19/15   Midge MiniumWohl, Darren, MD  quinapril (ACCUPRIL) 5  MG tablet Take 5 mg by mouth daily. 11/25/18   [provider]  traMADol (ULTRAM) 50 MG tablet Take 1 tablet (50 mg total) by mouth every 6 (six) hours as needed for up to 3 days. 12/22/18 12/25/18  Lannie Fields, PA-C    Allergies Patient has no known allergies.  Family History  Problem Relation Age of Onset  . Alcohol abuse Neg Hx   . Arthritis Neg Hx   . Asthma Neg Hx   . Birth defects Neg Hx   . Cancer Neg Hx   . COPD Neg Hx   . Depression Neg Hx   . Diabetes Neg Hx   . Drug abuse Neg Hx   . Early death Neg Hx   . Hearing loss  Neg Hx   . Heart disease Neg Hx   . Hyperlipidemia Neg Hx   . Hypertension Neg Hx   . Kidney disease Neg Hx   . Learning disabilities Neg Hx   . Mental illness Neg Hx   . Mental retardation Neg Hx   . Stroke Neg Hx   . Miscarriages / Stillbirths Neg Hx   . Vision loss Neg Hx   . Varicose Veins Neg Hx     Social History Social History   Tobacco Use  . Smoking status: Never Smoker  . Smokeless tobacco: Never Used  Substance Use Topics  . Alcohol use: Yes    Alcohol/week: 0.0 standard drinks    Comment: occas  . Drug use: No     Review of Systems  Constitutional: No fever/chills Eyes: No visual changes. No discharge ENT: Patient has dental pain.  Cardiovascular: no chest pain. Respiratory: no cough. No SOB. Gastrointestinal: No abdominal pain.  No nausea, no vomiting.  No diarrhea.  No constipation. Musculoskeletal: Negative for musculoskeletal pain. Skin: Negative for rash, abrasions, lacerations, ecchymosis. Neurological: Negative for headaches, focal weakness or numbness.   ____________________________________________   PHYSICAL EXAM:  VITAL SIGNS: ED Triage Vitals  Enc Vitals Group     BP 12/22/18 1536 126/77     Pulse Rate 12/22/18 1536 80     Resp 12/22/18 1536 16     Temp 12/22/18 1536 99.6 F (37.6 C)     Temp Source 12/22/18 1536 Oral     SpO2 12/22/18 1536 96 %     Weight 12/22/18 1539 211 lb (95.7 kg)     Height 12/22/18 1539 5' 4.96" (1.65 m)     Head Circumference --      Peak Flow --      Pain Score 12/22/18 1539 6     Pain Loc --      Pain Edu? --      Excl. in [place]? --      Constitutional: Alert and oriented. Well appearing and in no acute distress. Eyes: Conjunctivae are normal. PERRL. EOMI. Head: Atraumatic. ENT:      Ears:       Nose: No congestion/rhinnorhea.      Mouth/Throat: Mucous membranes are moist.  Patient has dental caries of inferior 29 and has some mild right lower jaw swelling.  No pain to palpation underneath the  tongue. Neck: No stridor.  No cervical spine tenderness to palpation. Cardiovascular: Normal rate, regular rhythm. Normal S1 and S2.  Good peripheral circulation. Respiratory: Normal respiratory effort without tachypnea or retractions. Lungs CTAB. Good air entry to the bases with no decreased or absent breath sounds.  Skin:  Skin is warm, dry and intact. No  rash noted. Psychiatric: Mood and affect are normal. Speech and behavior are normal. Patient exhibits appropriate insight and judgement.   ____________________________________________   LABS (all labs ordered are listed, but only abnormal results are displayed)  Labs Reviewed - No data to display ____________________________________________  EKG   ____________________________________________  RADIOLOGY   No results found.  ____________________________________________    PROCEDURES  Procedure(s) performed:    Procedures    Medications  traMADol (ULTRAM) tablet 50 mg (has no administration in time range)  amoxicillin (AMOXIL) capsule 500 mg (has no administration in time range)     ____________________________________________   INITIAL IMPRESSION / ASSESSMENT AND PLAN / ED COURSE  Pertinent labs & imaging results that were available during my care of the patient were reviewed by me and considered in my medical decision making (see chart for details).  Review of the [place] CSRS was performed in accordance of the [facility] prior to dispensing any controlled drugs.           Assessment and plan Dental pain Dental abscess 68 year old male presents to the emergency department with inferior 29 pain and mild right lower jaw swelling.  Vital signs are reassuring at triage.  Patient was given tramadol in the emergency department.  He was discharged with amoxicillin and tramadol.  He was given dental resources in his discharge paperwork.  All patient questions were  answered.     ____________________________________________  FINAL CLINICAL IMPRESSION(S) / ED DIAGNOSES  Final diagnoses:  Dental abscess      NEW MEDICATIONS STARTED DURING THIS VISIT:  ED Discharge Orders         Ordered    traMADol (ULTRAM) 50 MG tablet  Every 6 hours PRN     12/22/18 1751              This chart was dictated using voice recognition software/Dragon. Despite best efforts to proofread, errors can occur which can change the meaning. Any change was purely unintentional.    Orvil Feil, PA-C 12/22/18 1759    Dionne Bucy, MD 12/22/18 [phone]

## 2019-01-10 ENCOUNTER — Ambulatory Visit: Payer: Medicare Other | Admitting: Urology

## 2019-01-10 ENCOUNTER — Encounter: Payer: Self-pay | Admitting: Urology

## 2019-04-09 ENCOUNTER — Encounter: Payer: Self-pay | Admitting: Urology

## 2019-04-09 ENCOUNTER — Ambulatory Visit: Payer: Medicare Other | Admitting: Urology

## 2019-05-08 DEATH — deceased
